# Patient Record
Sex: Female | Born: 1980 | Race: White | Hispanic: No | Marital: Married | Smoking: Former smoker
Health system: Southern US, Community
[De-identification: ages and names within clinical notes are randomized; demographics above are authoritative.]

## PROBLEM LIST (undated history)

## (undated) DIAGNOSIS — N390 Urinary tract infection, site not specified: Secondary | ICD-10-CM

## (undated) DIAGNOSIS — M542 Cervicalgia: Secondary | ICD-10-CM

## (undated) DIAGNOSIS — K5900 Constipation, unspecified: Secondary | ICD-10-CM

## (undated) DIAGNOSIS — M1990 Unspecified osteoarthritis, unspecified site: Secondary | ICD-10-CM

## (undated) DIAGNOSIS — E119 Type 2 diabetes mellitus without complications: Secondary | ICD-10-CM

## (undated) DIAGNOSIS — F419 Anxiety disorder, unspecified: Secondary | ICD-10-CM

## (undated) DIAGNOSIS — G8929 Other chronic pain: Secondary | ICD-10-CM

## (undated) DIAGNOSIS — G43909 Migraine, unspecified, not intractable, without status migrainosus: Secondary | ICD-10-CM

## (undated) DIAGNOSIS — R7303 Prediabetes: Secondary | ICD-10-CM

## (undated) DIAGNOSIS — K602 Anal fissure, unspecified: Secondary | ICD-10-CM

## (undated) DIAGNOSIS — K649 Unspecified hemorrhoids: Secondary | ICD-10-CM

## (undated) HISTORY — DX: Anxiety disorder, unspecified: F41.9

## (undated) HISTORY — DX: Cervicalgia: M54.2

## (undated) HISTORY — DX: Urinary tract infection, site not specified: N39.0

## (undated) HISTORY — DX: Unspecified osteoarthritis, unspecified site: M19.90

## (undated) HISTORY — PX: TONSILLECTOMY: SUR1361

## (undated) HISTORY — DX: Prediabetes: R73.03

## (undated) HISTORY — DX: Unspecified hemorrhoids: K64.9

## (undated) HISTORY — DX: Constipation, unspecified: K59.00

## (undated) HISTORY — DX: Migraine, unspecified, not intractable, without status migrainosus: G43.909

## (undated) HISTORY — PX: BUNIONECTOMY: SHX129

## (undated) HISTORY — DX: Anal fissure, unspecified: K60.2

## (undated) HISTORY — PX: CERVICAL FUSION: SHX112

## (undated) HISTORY — DX: Other chronic pain: G89.29

## (undated) HISTORY — PX: TUBAL LIGATION: SHX77

---
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
## 2007-08-31 ENCOUNTER — Ambulatory Visit (HOSPITAL_COMMUNITY): Admission: RE | Admit: 2007-08-31 | Discharge: 2007-08-31 | Payer: Self-pay | Admitting: Family Medicine

## 2008-09-08 ENCOUNTER — Ambulatory Visit (HOSPITAL_COMMUNITY): Admission: RE | Admit: 2008-09-08 | Discharge: 2008-09-08 | Payer: Self-pay | Admitting: Family Medicine

## 2008-12-11 ENCOUNTER — Ambulatory Visit (HOSPITAL_COMMUNITY): Admission: RE | Admit: 2008-12-11 | Discharge: 2008-12-11 | Payer: Self-pay | Admitting: Family Medicine

## 2009-02-21 ENCOUNTER — Ambulatory Visit (HOSPITAL_COMMUNITY): Admission: RE | Admit: 2009-02-21 | Discharge: 2009-02-21 | Payer: Self-pay | Admitting: Family Medicine

## 2010-03-12 ENCOUNTER — Inpatient Hospital Stay (HOSPITAL_COMMUNITY): Admission: AD | Admit: 2010-03-12 | Discharge: 2010-03-12 | Payer: Self-pay | Admitting: Obstetrics and Gynecology

## 2010-03-16 ENCOUNTER — Inpatient Hospital Stay (HOSPITAL_COMMUNITY): Admission: AD | Admit: 2010-03-16 | Discharge: 2010-03-16 | Payer: Self-pay | Admitting: Obstetrics & Gynecology

## 2010-05-13 ENCOUNTER — Ambulatory Visit: Payer: Self-pay | Admitting: Advanced Practice Midwife

## 2010-05-13 ENCOUNTER — Inpatient Hospital Stay (HOSPITAL_COMMUNITY): Admission: AD | Admit: 2010-05-13 | Discharge: 2010-05-13 | Payer: Self-pay | Admitting: Obstetrics and Gynecology

## 2010-06-12 ENCOUNTER — Other Ambulatory Visit: Payer: Self-pay | Admitting: Obstetrics and Gynecology

## 2010-06-12 ENCOUNTER — Inpatient Hospital Stay (HOSPITAL_COMMUNITY): Admission: EM | Admit: 2010-06-12 | Discharge: 2010-06-14 | Payer: Self-pay | Admitting: Emergency Medicine

## 2010-06-13 ENCOUNTER — Encounter (INDEPENDENT_AMBULATORY_CARE_PROVIDER_SITE_OTHER): Payer: Self-pay | Admitting: Diagnostic Neuroimaging

## 2010-07-16 ENCOUNTER — Encounter (INDEPENDENT_AMBULATORY_CARE_PROVIDER_SITE_OTHER): Payer: Self-pay | Admitting: Obstetrics and Gynecology

## 2010-07-16 ENCOUNTER — Inpatient Hospital Stay (HOSPITAL_COMMUNITY): Admission: AD | Admit: 2010-07-16 | Discharge: 2010-07-16 | Payer: Self-pay | Admitting: Obstetrics and Gynecology

## 2010-07-16 ENCOUNTER — Ambulatory Visit: Payer: Self-pay | Admitting: Obstetrics and Gynecology

## 2010-07-16 ENCOUNTER — Ambulatory Visit: Payer: Self-pay | Admitting: Vascular Surgery

## 2010-07-18 ENCOUNTER — Inpatient Hospital Stay (HOSPITAL_COMMUNITY): Admission: RE | Admit: 2010-07-18 | Discharge: 2010-07-21 | Payer: Self-pay | Admitting: Obstetrics and Gynecology

## 2010-07-18 ENCOUNTER — Encounter (INDEPENDENT_AMBULATORY_CARE_PROVIDER_SITE_OTHER): Payer: Self-pay | Admitting: Obstetrics and Gynecology

## 2011-02-07 LAB — CBC
HCT: 30.7 % — ABNORMAL LOW (ref 36.0–46.0)
HCT: 32.2 % — ABNORMAL LOW (ref 36.0–46.0)
MCH: 34.3 pg — ABNORMAL HIGH (ref 26.0–34.0)
MCH: 34.6 pg — ABNORMAL HIGH (ref 26.0–34.0)
MCHC: 35.3 g/dL (ref 30.0–36.0)
MCV: 97.2 fL (ref 78.0–100.0)
MCV: 97.3 fL (ref 78.0–100.0)
Platelets: 134 10*3/uL — ABNORMAL LOW (ref 150–400)
Platelets: 145 10*3/uL — ABNORMAL LOW (ref 150–400)
RBC: 2.78 MIL/uL — ABNORMAL LOW (ref 3.87–5.11)
RDW: 12.8 % (ref 11.5–15.5)
RDW: 13.1 % (ref 11.5–15.5)
WBC: 6.4 10*3/uL (ref 4.0–10.5)
WBC: 7.9 10*3/uL (ref 4.0–10.5)

## 2011-02-07 LAB — BASIC METABOLIC PANEL
BUN: 4 mg/dL — ABNORMAL LOW (ref 6–23)
CO2: 24 mEq/L (ref 19–32)
Calcium: 9 mg/dL (ref 8.4–10.5)
Chloride: 107 mEq/L (ref 96–112)
Creatinine, Ser: 0.38 mg/dL — ABNORMAL LOW (ref 0.4–1.2)
Potassium: 3.6 mEq/L (ref 3.5–5.1)
Sodium: 135 mEq/L (ref 135–145)

## 2011-02-07 LAB — COMPREHENSIVE METABOLIC PANEL
ALT: 14 U/L (ref 0–35)
GFR calc non Af Amer: >60 mL/min (ref >60)
Glucose, Bld: 87 mg/dL (ref 70–99)
Potassium: 4.2 mEq/L (ref 3.5–5.1)
Sodium: 134 mEq/L — ABNORMAL LOW (ref 135–145)
Total Bilirubin: 0.5 mg/dL (ref 0.3–1.2)

## 2011-02-07 LAB — RPR: RPR Ser Ql: NONREACTIVE

## 2011-02-07 LAB — URIC ACID: Uric Acid, Serum: 3.3 mg/dL (ref 2.4–7.0)

## 2011-02-08 LAB — CBC
Hemoglobin: 11.8 g/dL — ABNORMAL LOW (ref 12.0–15.0)
MCH: 34 pg (ref 26.0–34.0)
Platelets: 165 10*3/uL (ref 150–400)
RBC: 3.46 MIL/uL — ABNORMAL LOW (ref 3.87–5.11)
WBC: 9.7 10*3/uL (ref 4.0–10.5)

## 2011-02-08 LAB — URINALYSIS, ROUTINE W REFLEX MICROSCOPIC
Bilirubin Urine: NEGATIVE
Glucose, UA: 100 mg/dL — AB
Nitrite: NEGATIVE
Specific Gravity, Urine: 1.01 (ref 1.005–1.030)
Urobilinogen, UA: 0.2 mg/dL (ref 0.0–1.0)
pH: 7 (ref 5.0–8.0)

## 2011-02-08 LAB — BASIC METABOLIC PANEL
BUN: 3 mg/dL — ABNORMAL LOW (ref 6–23)
CO2: 23 mEq/L (ref 19–32)
Calcium: 8.4 mg/dL (ref 8.4–10.5)
Chloride: 103 mEq/L (ref 96–112)
Creatinine, Ser: 0.47 mg/dL (ref 0.4–1.2)
GFR calc Af Amer: >60 mL/min (ref >60)
GFR calc non Af Amer: >60 mL/min (ref >60)
Glucose, Bld: 82 mg/dL (ref 70–99)
Potassium: 3.7 mEq/L (ref 3.5–5.1)
Sodium: 132 mEq/L — ABNORMAL LOW (ref 135–145)

## 2011-02-08 LAB — PROTIME-INR: INR: 0.9 (ref 0.00–1.49)

## 2011-02-08 LAB — COMPREHENSIVE METABOLIC PANEL
ALT: 11 U/L (ref 0–35)
AST: 17 U/L (ref 0–37)
CO2: 24 mEq/L (ref 19–32)
Calcium: 8.5 mg/dL (ref 8.4–10.5)
Creatinine, Ser: 0.46 mg/dL (ref 0.4–1.2)
GFR calc Af Amer: >60 mL/min (ref >60)
GFR calc non Af Amer: >60 mL/min (ref >60)
Sodium: 132 mEq/L — ABNORMAL LOW (ref 135–145)
Total Protein: 5.9 g/dL — ABNORMAL LOW (ref 6.0–8.3)

## 2011-02-08 LAB — CARDIAC PANEL(CRET KIN+CKTOT+MB+TROPI)
Relative Index: INVALID (ref 0.0–2.5)
Total CK: 34 U/L (ref 7–177)
Troponin I: 0.01 ng/mL (ref 0.00–0.06)

## 2011-02-08 LAB — LIPID PANEL
HDL: 57 mg/dL (ref >39)
Triglycerides: 471 mg/dL — ABNORMAL HIGH (ref <150)
VLDL: UNDETERMINED mg/dL (ref 0–40)

## 2011-02-08 LAB — APTT: aPTT: 27 seconds (ref 24–37)

## 2011-02-08 LAB — GLUCOSE, CAPILLARY
Glucose-Capillary: 105 mg/dL — ABNORMAL HIGH (ref 70–99)
Glucose-Capillary: 135 mg/dL — ABNORMAL HIGH (ref 70–99)
Glucose-Capillary: 71 mg/dL (ref 70–99)
Glucose-Capillary: 82 mg/dL (ref 70–99)
Glucose-Capillary: 89 mg/dL (ref 70–99)

## 2011-02-08 LAB — DIFFERENTIAL
Lymphocytes Relative: 24 % (ref 12–46)
Lymphs Abs: 2.3 10*3/uL (ref 0.7–4.0)
Monocytes Relative: 6 % (ref 3–12)
Neutro Abs: 6.6 10*3/uL (ref 1.7–7.7)
Neutrophils Relative %: 68 % (ref 43–77)

## 2011-02-08 LAB — HEMOGLOBIN A1C: Hgb A1c MFr Bld: 5.1 % (ref <5.7)

## 2011-02-09 LAB — COMPREHENSIVE METABOLIC PANEL
ALT: 12 U/L (ref 0–35)
AST: 16 U/L (ref 0–37)
Calcium: 9.3 mg/dL (ref 8.4–10.5)
Total Protein: 5.7 g/dL — ABNORMAL LOW (ref 6.0–8.3)

## 2011-02-09 LAB — URINALYSIS, ROUTINE W REFLEX MICROSCOPIC
Bilirubin Urine: NEGATIVE
Glucose, UA: NEGATIVE mg/dL
Hgb urine dipstick: NEGATIVE
Specific Gravity, Urine: >1.03 — ABNORMAL HIGH (ref 1.005–1.030)

## 2011-02-09 LAB — GLUCOSE, CAPILLARY: Glucose-Capillary: 103 mg/dL — ABNORMAL HIGH (ref 70–99)

## 2011-02-09 LAB — CBC
HCT: 32.4 % — ABNORMAL LOW (ref 36.0–46.0)
Hemoglobin: 11.6 g/dL — ABNORMAL LOW (ref 12.0–15.0)
MCV: 96.7 fL (ref 78.0–100.0)
RBC: 3.36 MIL/uL — ABNORMAL LOW (ref 3.87–5.11)
WBC: 8.9 10*3/uL (ref 4.0–10.5)

## 2011-02-09 LAB — LACTATE DEHYDROGENASE: LDH: 113 U/L (ref 94–250)

## 2011-02-11 LAB — COMPREHENSIVE METABOLIC PANEL
AST: 16 U/L (ref 0–37)
Alkaline Phosphatase: 60 U/L (ref 39–117)
CO2: 25 mEq/L (ref 19–32)
Chloride: 103 mEq/L (ref 96–112)
Creatinine, Ser: 0.46 mg/dL (ref 0.4–1.2)
GFR calc Af Amer: >60 mL/min (ref >60)
GFR calc non Af Amer: >60 mL/min (ref >60)
Potassium: 3.8 mEq/L (ref 3.5–5.1)
Total Bilirubin: 0.5 mg/dL (ref 0.3–1.2)

## 2011-02-11 LAB — CBC
HCT: 35.6 % — ABNORMAL LOW (ref 36.0–46.0)
MCV: 96.3 fL (ref 78.0–100.0)
RBC: 3.7 MIL/uL — ABNORMAL LOW (ref 3.87–5.11)
WBC: 10.9 10*3/uL — ABNORMAL HIGH (ref 4.0–10.5)

## 2011-02-11 LAB — DIFFERENTIAL
Basophils Absolute: 0.1 10*3/uL (ref 0.0–0.1)
Basophils Relative: 1 % (ref 0–1)
Eosinophils Absolute: 0.2 10*3/uL (ref 0.0–0.7)
Eosinophils Relative: 2 % (ref 0–5)
Lymphocytes Relative: 19 % (ref 12–46)
Monocytes Absolute: 0.6 10*3/uL (ref 0.1–1.0)

## 2011-02-11 LAB — URINALYSIS, ROUTINE W REFLEX MICROSCOPIC
Bilirubin Urine: NEGATIVE
Hgb urine dipstick: NEGATIVE
Ketones, ur: NEGATIVE mg/dL
Nitrite: NEGATIVE
Protein, ur: NEGATIVE mg/dL
Specific Gravity, Urine: 1.02 (ref 1.005–1.030)
Urobilinogen, UA: 0.2 mg/dL (ref 0.0–1.0)

## 2011-02-11 LAB — LIPASE, BLOOD: Lipase: 18 U/L (ref 11–59)

## 2011-02-11 LAB — AMYLASE: Amylase: 37 U/L (ref 0–105)

## 2012-03-14 ENCOUNTER — Emergency Department (HOSPITAL_COMMUNITY)
Admission: EM | Admit: 2012-03-14 | Discharge: 2012-03-15 | Disposition: A | Discharge status: Home / Self Care | Payer: Medicaid Other | Attending: Emergency Medicine | Admitting: Emergency Medicine

## 2012-03-14 ENCOUNTER — Encounter (HOSPITAL_COMMUNITY): Payer: Self-pay | Admitting: Emergency Medicine

## 2012-03-14 DIAGNOSIS — K644 Residual hemorrhoidal skin tags: Secondary | ICD-10-CM | POA: Insufficient documentation

## 2012-03-14 DIAGNOSIS — K625 Hemorrhage of anus and rectum: Secondary | ICD-10-CM | POA: Insufficient documentation

## 2012-03-14 DIAGNOSIS — K6289 Other specified diseases of anus and rectum: Secondary | ICD-10-CM | POA: Insufficient documentation

## 2012-03-14 DIAGNOSIS — R509 Fever, unspecified: Secondary | ICD-10-CM | POA: Insufficient documentation

## 2012-03-14 DIAGNOSIS — R141 Gas pain: Secondary | ICD-10-CM | POA: Insufficient documentation

## 2012-03-14 DIAGNOSIS — K5900 Constipation, unspecified: Secondary | ICD-10-CM | POA: Insufficient documentation

## 2012-03-14 DIAGNOSIS — R142 Eructation: Secondary | ICD-10-CM | POA: Insufficient documentation

## 2012-03-14 DIAGNOSIS — R1084 Generalized abdominal pain: Secondary | ICD-10-CM | POA: Insufficient documentation

## 2012-03-14 DIAGNOSIS — R198 Other specified symptoms and signs involving the digestive system and abdomen: Secondary | ICD-10-CM | POA: Insufficient documentation

## 2012-03-14 DIAGNOSIS — K921 Melena: Secondary | ICD-10-CM | POA: Insufficient documentation

## 2012-03-14 MED ORDER — SODIUM CHLORIDE 0.9 % IV SOLN
1000.0000 mL | INTRAVENOUS | Status: DC
  Administered 2012-03-15: 1000 mL via INTRAVENOUS

## 2012-03-14 MED ORDER — SODIUM CHLORIDE 0.9 % IV SOLN
1000.0000 mL | Freq: Once | INTRAVENOUS | Status: AC
  Administered 2012-03-14: 1000 mL via INTRAVENOUS

## 2012-03-14 NOTE — ED Notes (Signed)
Pt noticed Friday night after giving herself a enema there was a large amount of bright red blood in the toilet.  Pt states that sense then she has had bright red blood with each BM.  Fever of 100 tonight.

## 2012-03-15 ENCOUNTER — Emergency Department (HOSPITAL_COMMUNITY): Payer: Medicaid Other

## 2012-03-15 LAB — DIFFERENTIAL
Eosinophils Relative: 2 % (ref 0–5)
Lymphocytes Relative: 27 % (ref 12–46)
Lymphs Abs: 2.9 10*3/uL (ref 0.7–4.0)
Monocytes Absolute: 0.5 10*3/uL (ref 0.1–1.0)
Monocytes Relative: 4 % (ref 3–12)
Neutro Abs: 7.1 10*3/uL (ref 1.7–7.7)

## 2012-03-15 LAB — PROTIME-INR: Prothrombin Time: 13.2 seconds (ref 11.6–15.2)

## 2012-03-15 LAB — URINALYSIS, ROUTINE W REFLEX MICROSCOPIC
Bilirubin Urine: NEGATIVE
Glucose, UA: NEGATIVE mg/dL
Hgb urine dipstick: NEGATIVE
Protein, ur: NEGATIVE mg/dL

## 2012-03-15 LAB — CBC
HCT: 37.2 % (ref 36.0–46.0)
Hemoglobin: 12.7 g/dL (ref 12.0–15.0)
MCV: 93.2 fL (ref 78.0–100.0)
RDW: 12.2 % (ref 11.5–15.5)
WBC: 10.8 10*3/uL — ABNORMAL HIGH (ref 4.0–10.5)

## 2012-03-15 LAB — APTT: aPTT: 31 seconds (ref 24–37)

## 2012-03-15 LAB — COMPREHENSIVE METABOLIC PANEL
AST: 17 U/L (ref 0–37)
BUN: 10 mg/dL (ref 6–23)
CO2: 26 mEq/L (ref 19–32)
Calcium: 9.1 mg/dL (ref 8.4–10.5)
Chloride: 102 mEq/L (ref 96–112)
Creatinine, Ser: 0.75 mg/dL (ref 0.50–1.10)
GFR calc Af Amer: >90 mL/min (ref >90)
GFR calc non Af Amer: >90 mL/min (ref >90)
Glucose, Bld: 127 mg/dL — ABNORMAL HIGH (ref 70–99)
Total Bilirubin: 0.3 mg/dL (ref 0.3–1.2)

## 2012-03-15 LAB — POCT PREGNANCY, URINE: Preg Test, Ur: NEGATIVE

## 2012-03-15 LAB — LIPASE, BLOOD: Lipase: 18 U/L (ref 11–59)

## 2012-03-15 MED ORDER — HYDROMORPHONE HCL PF 2 MG/ML IJ SOLN
INTRAMUSCULAR | Status: AC
  Administered 2012-03-15: 1 mg
  Filled 2012-03-15: qty 1

## 2012-03-15 MED ORDER — LIDOCAINE HCL (CARDIAC) 20 MG/ML IV SOLN
INTRAVENOUS | Status: AC
  Administered 2012-03-15: 02:00:00
  Filled 2012-03-15: qty 5

## 2012-03-15 MED ORDER — HYDROMORPHONE HCL PF 1 MG/ML IJ SOLN
1.0000 mg | Freq: Once | INTRAMUSCULAR | Status: AC
  Administered 2012-03-15: 1 mg via INTRAVENOUS
  Filled 2012-03-15: qty 1

## 2012-03-15 MED ORDER — SENNOSIDES-DOCUSATE SODIUM 8.6-50 MG PO TABS: 1.0000 | ORAL_TABLET | Freq: Every day | ORAL | Status: AC | PRN

## 2012-03-15 MED ORDER — PHENYLEPH-SHARK LIV OIL-MO-PET 0.25-3-14-71.9 % RE OINT: TOPICAL_OINTMENT | Freq: Two times a day (BID) | RECTAL | Status: AC | PRN

## 2012-03-15 MED ORDER — HYDROMORPHONE HCL PF 1 MG/ML IJ SOLN: 1.0000 mg | Freq: Once | INTRAMUSCULAR | Status: DC

## 2012-03-15 MED ORDER — MAGNESIUM SULFATE GRAN: GRANULES | Freq: Two times a day (BID) | Status: AC | PRN

## 2012-03-15 MED ORDER — LIDOCAINE HCL 2 % EX GEL: Freq: Once | CUTANEOUS | Status: DC

## 2012-03-15 MED ORDER — OXYCODONE-ACETAMINOPHEN 5-325 MG PO TABS: 2.0000 | ORAL_TABLET | ORAL | Status: AC | PRN

## 2012-03-15 MED ORDER — ONDANSETRON HCL 4 MG/2ML IJ SOLN
4.0000 mg | Freq: Once | INTRAMUSCULAR | Status: AC
  Administered 2012-03-15: 4 mg via INTRAVENOUS

## 2012-03-15 MED ORDER — HYDROCORTISONE ACETATE 25 MG RE SUPP: 25.0000 mg | Freq: Two times a day (BID) | RECTAL | Status: AC | PRN

## 2012-03-15 NOTE — ED Provider Notes (Signed)
History     CSN: [id number]  Arrival date & time 03/14/12  2219   First MD Initiated Contact with Patient 03/14/12 2312      Chief Complaint  Patient presents with  . Rectal Bleeding  . Fever    (Consider location/radiation/quality/duration/timing/severity/associated sxs/prior treatment) HPI Comments: The patient is a 31 year old female with a history of hemorrhoids secondary to constipation from frequent use of Percocet for chronic pain. Her constipation is not new, however on Friday, 2 days ago, she was administering an enema to herself to treat constipation and upon purging the rectal effluent, she noticed red blood and blood clots present. She reports several more episodes of tenesmus and small amounts of red blood and blood clots passed into the toilet over the course that night. Rectal bleeding has stopped at this time. She reports rectal pain, reports mild generalized cramping abdominal discomfort and fullness, denies nausea or vomiting, dysuria, vaginal bleeding or vaginal discharge. She denies previous history of similar symptoms. She denies shortness of breath, lightheadedness, chest pain, or palpitations. She has currently been treating her hemorrhoids with warm water bath soaks and preparation H. cream, witch hazel pads externally. She reports insufficient relief of discomfort at the anus.  Patient is a 31 y.o. female presenting with hematochezia. The history is provided by the patient.  Rectal Bleeding  The current episode started 2 days ago. The onset was sudden. The problem occurs occasionally. The problem has been resolved. The pain is moderate. The stool is described as hard and streaked with blood. Prior successful therapies include enemas. Associated symptoms include abdominal pain (mild, diffuse, crampy, with perceived bloating, attributed by patient to constipation.  No recent change in this symptom with the rectal bleeding.), hemorrhoids and rectal pain. Pertinent negatives  include no anorexia, no fever (pt. reports temperature of 100 degrees Fahrenheit today), no diarrhea, no hematemesis, no nausea, no vomiting, no hematuria, no vaginal bleeding, no vaginal discharge, no chest pain, no headaches, no coughing, no difficulty breathing and no rash. She has been behaving normally. She has been eating and drinking normally. Urine output has been normal. Her past medical history does not include abdominal surgery (prior caesarian section, otherwise, no abdominal surgeries.), inflammatory bowel disease, recent abdominal injury, recent antibiotic use, recent change in diet or a recent illness. There were no sick contacts. She has received no recent medical care.    History reviewed. No pertinent past medical history.  Past Surgical History  Procedure Date  . Cesarean section   . Tonsillectomy     No family history on file.  History  Substance Use Topics  . Smoking status: Current Everyday Smoker -- 2.0 packs/day for 10 years    Types: Cigarettes  . Smokeless tobacco: Not on file  . Alcohol Use: Yes    OB History    Grav Para Term Preterm Abortions TAB SAB Ect Mult Living                  Review of Systems  Constitutional: Negative for fever (pt. reports temperature of 100 degrees Fahrenheit today), diaphoresis, fatigue and unexpected weight change.  HENT: Negative.   Eyes: Negative.   Respiratory: Negative.  Negative for cough.   Cardiovascular: Negative.  Negative for chest pain.  Gastrointestinal: Positive for abdominal pain (mild, diffuse, crampy, with perceived bloating, attributed by patient to constipation.  No recent change in this symptom with the rectal bleeding.), blood in stool, hematochezia, anal bleeding, rectal pain and hemorrhoids. Negative for nausea, vomiting,  diarrhea, constipation, abdominal distention, anorexia and hematemesis.  Genitourinary: Negative for hematuria, vaginal bleeding and vaginal discharge.  Musculoskeletal: Negative.     Skin: Negative for color change, pallor and rash.  Neurological: Negative for syncope, weakness, light-headedness and headaches.  Hematological: Does not bruise/bleed easily.  Psychiatric/Behavioral: Negative.   All other systems reviewed and are negative.    Allergies  Wellbutrin and Cephalosporins  Home Medications   Current Outpatient Rx  Name Route Sig Dispense Refill  . HYDROCORTISONE ACETATE 25 MG RE SUPP Rectal Place 1 suppository (25 mg total) rectally 2 (two) times daily as needed for hemorrhoids. 24 suppository 1  . MAGNESIUM SULFATE GRAN Topical Apply topically 2 (two) times daily as needed. Mix with warm water to make a Sitz Bath to soak hemorrhoids in for relief of pain and inflammation.  Mixing instructions are on the carton/box. 500 g 0  . OXYCODONE-ACETAMINOPHEN 5-325 MG PO TABS Oral Take 2 tablets by mouth every 4 (four) hours as needed for pain. 15 tablet 0  . PHENYLEPH-SHARK LIV OIL-MO-PET 0.25-3-14-71.9 % RE OINT Rectal Place rectally 2 (two) times daily as needed for hemorrhoids. This medication may be used in combination with Anusol-HC suppository 30 g 1  . SENNOSIDES-DOCUSATE SODIUM 8.6-50 MG PO TABS Oral Take 1-2 tablets by mouth daily as needed for constipation. 30 tablet 1    BP 122/53  Pulse 114  Temp(Src) 99 F (37.2 C) (Oral)  Resp 20  Ht 5\' 2"  (1.575 m)  Wt 123 lb (55.792 kg)  BMI 22.50 kg/m2  SpO2 99%  LMP 03/07/2012  Physical Exam  Nursing note and vitals reviewed. Constitutional: She is oriented to person, place, and time. She appears well-developed and well-nourished. No distress.  HENT:  Head: Normocephalic and atraumatic.  Nose: Nose normal.  Mouth/Throat: Oropharynx is clear and moist. No oropharyngeal exudate.  Eyes: Conjunctivae and EOM are normal. Pupils are equal, round, and reactive to light. Right eye exhibits no discharge. Left eye exhibits no discharge.  Neck: Normal range of motion. Neck supple. No JVD present.  Cardiovascular:  Normal rate, regular rhythm, normal heart sounds and intact distal pulses.  Exam reveals no gallop and no friction rub.   No murmur heard. Pulmonary/Chest: Effort normal and breath sounds normal. No respiratory distress. She has no wheezes. She has no rales. She exhibits no tenderness.  Abdominal: Soft. Normal appearance and bowel sounds are normal. She exhibits no distension, no pulsatile liver, no abdominal bruit, no ascites, no pulsatile midline mass and no mass. There is no hepatosplenomegaly. There is generalized tenderness (mild). There is no rigidity, no rebound, no guarding, no CVA tenderness, no tenderness at McBurney's point and negative Murphy's sign. No hernia.  Genitourinary: Rectal exam shows external hemorrhoid and tenderness. Rectal exam shows no internal hemorrhoid, no fissure, no mass and anal tone normal. Guaiac positive stool (faint pink residual secretions on rectal exam, no stool, no blood clots, no active bleeding rectally). Pelvic exam was performed with patient supine.       Anoscope inserted gently into the patient's rectum for internal rectal evaluation.  No apparent internal hemorrhoids or mucosal lesions.  Pt. Passing gas.  External hemorrhoids x 3, prolapsed/reducible, non-thrombosed, without active bleeding, moderate tenderness to palpation.  No internal rectal fluctuance/mass on digital rectal exam to suggest perirectal abscess.  Musculoskeletal: Normal range of motion. She exhibits no edema and no tenderness.  Neurological: She is alert and oriented to person, place, and time. No cranial nerve deficit. She exhibits normal muscle  tone. Coordination normal.  Skin: Skin is warm and dry. No rash noted. No erythema. No pallor.  Psychiatric: She has a normal mood and affect. Her behavior is normal. Judgment and thought content normal.    ED Course  Procedures (including critical care time)  Labs Reviewed  CBC - Abnormal; Notable for the following:    WBC 10.8 (*)    All  other components within normal limits  COMPREHENSIVE METABOLIC PANEL - Abnormal; Notable for the following:    Glucose, Bld 127 (*)    All other components within normal limits  URINALYSIS, ROUTINE W REFLEX MICROSCOPIC - Abnormal; Notable for the following:    Specific Gravity, Urine <1.005 (*)    All other components within normal limits  DIFFERENTIAL  LIPASE, BLOOD  APTT  PROTIME-INR  POCT PREGNANCY, URINE   Dg Abd Acute W/chest  03/15/2012   *RADIOLOGY REPORT*  Clinical Data: Blood in stool  ACUTE ABDOMEN SERIES (ABDOMEN 2 VIEW & CHEST 1 VIEW)  Comparison:  None.  Findings:  There is no evidence of dilated bowel loops or free intraperitoneal air.  No radiopaque calculi or other significant radiographic abnormality is seen. Heart size and mediastinal contours are within normal limits.  Both lungs are clear. Incidental note is made of a 2 mm radiopacity over the right upper quadrant which does not overlie the expected contours of the kidney and could represent bowel content, artifact, gallstones, or possible aberrant location of the kidney with renal calculus.  IMPRESSION: Negative abdominal radiographs.  No acute cardiopulmonary disease.  Original Report Authenticated By: Harrel Lemon, M.D.     1. External hemorrhoid   2. Rectal pain   3. Constipation       MDM  Gastritis, peptic ulcer disease, biliary colic, cholelithiasis, cholecystitis, cholangitis, hepatitis, renal colic, urinary tract infection, colitis, constipation, gastroenteritis, diverticulitis, inflammatory bowel disease, internal hemorrhoids, external hemorrhoids, perirectal abscess, cancer all considered among other etiologies in the patient's differential diagnosis.       Felisa Bonier, MD 03/15/12 0630

## 2012-03-15 NOTE — Discharge Instructions (Signed)
Constipation in Adults Constipation is having fewer than 2 bowel movements per week. Usually, the stools are hard. As we grow older, constipation is more common. If you try to fix constipation with laxatives, the problem may get worse. This is because laxatives taken over a long period of time make the colon muscles weaker. A low-fiber diet, not taking in enough fluids, and taking some medicines may make these problems worse. MEDICATIONS THAT MAY CAUSE CONSTIPATION  Water pills (diuretics).   Calcium channel blockers (used to control blood pressure and for the heart).   Certain pain medicines (narcotics).   Anticholinergics.   Anti-inflammatory agents.   Antacids that contain aluminum.  DISEASES THAT CONTRIBUTE TO CONSTIPATION  Diabetes.   Parkinson's disease.   Dementia.   Stroke.   Depression.   Illnesses that cause problems with salt and water metabolism.  HOME CARE INSTRUCTIONS   Constipation is usually best cared for without medicines. Increasing dietary fiber and eating more fruits and vegetables is the best way to manage constipation.   Slowly increase fiber intake to 25 to 38 grams per day. Whole grains, fruits, vegetables, and legumes are good sources of fiber. A dietitian can further help you incorporate high-fiber foods into your diet.   Drink enough water and fluids to keep your urine clear or pale yellow.   A fiber supplement may be added to your diet if you cannot get enough fiber from foods.   Increasing your activities also helps improve regularity.   Suppositories, as suggested by your caregiver, will also help. If you are using antacids, such as aluminum or calcium containing products, it will be helpful to switch to products containing magnesium if your caregiver says it is okay.   If you have been given a liquid injection (enema) today, this is only a temporary measure. It should not be relied on for treatment of longstanding (chronic) constipation.    Stronger measures, such as magnesium sulfate, should be avoided if possible. This may cause uncontrollable diarrhea. Using magnesium sulfate may not allow you time to make it to the bathroom.  SEEK IMMEDIATE MEDICAL CARE IF:   There is bright red blood in the stool.   The constipation stays for more than 4 days.   There is belly (abdominal) or rectal pain.   You do not seem to be getting better.   You have any questions or concerns.  MAKE SURE YOU:   Understand these instructions.   Will watch your condition.   Will get help right away if you are not doing well or get worse.  Document Released: 08/08/2004 Document Revised: 10/30/2011 Document Reviewed: 10/14/2011 [facility] Patient Information 2012 [place], [place].Fiber Content in Foods Drinking plenty of fluids and consuming foods high in fiber can help with constipation. See the list below for the fiber content of some common foods. Starches and Grains / Dietary Fiber (g)  Cheerios, 1 cup / 3 g   Kellogg's Corn Flakes, 1 cup / 0.7 g   Rice Krispies, 1  cup / 0.3 g   Quaker Oat Life Cereal,  cup / 2.1 g   Oatmeal, instant (cooked),  cup / 2 g   Kellogg's Frosted Mini Wheats, 1 cup / 5.1 g   Rice, brown, long-grain (cooked), 1 cup / 3.5 g   Rice, white, long-grain (cooked), 1 cup / 0.6 g   Macaroni, cooked, enriched, 1 cup / 2.5 g  Legumes / Dietary Fiber (g)  Beans, baked, canned, plain or vegetarian,  cup /  5.2 g   Beans, kidney, canned,  cup / 6.8 g   Beans, pinto, dried (cooked),  cup / 7.7 g   Beans, pinto, canned,  cup / 5.5 g  Breads and Crackers / Dietary Fiber (g)  Graham crackers, plain or honey, 2 squares / 0.7 g   Saltine crackers, 3 squares / 0.3 g   Pretzels, plain, salted, 10 pieces / 1.8 g   Bread, whole-wheat, 1 slice / 1.9 g   Bread, white, 1 slice / 0.7 g   Bread, raisin, 1 slice / 1.2 g   Bagel, plain, 3 oz / 2 g   Tortilla, flour, 1 oz / 0.9 g   Tortilla, corn, 1 small  / 1.5 g   Bun, hamburger or hotdog, 1 small / 0.9 g  Fruits / Dietary Fiber (g)  Apple, raw with skin, 1 medium / 4.4 g   Applesauce, sweetened,  cup / 1.5 g   Banana,  medium / 1.5 g   Grapes, 10 grapes / 0.4 g   Orange, 1 small / 2.3 g   Raisin, 1.5 oz / 1.6 g   Melon, 1 cup / 1.4 g  Vegetables / Dietary Fiber (g)  Green beans, canned,  cup / 1.3 g   Carrots (cooked),  cup / 2.3 g   Broccoli (cooked),  cup / 2.8 g   Peas, frozen (cooked),  cup / 4.4 g   Potatoes, mashed,  cup / 1.6 g   Lettuce, 1 cup / 0.5 g   Corn, canned,  cup / 1.6 g   Tomato,  cup / 1.1 g  Document Released: 03/29/2007 Document Revised: 10/30/2011 Document Reviewed: 05/24/2007 [facility] Patient Information 2012 [place], [place].High Fiber Diet A high fiber diet changes your normal diet to include more whole grains, legumes, fruits, and vegetables. Changes in the diet involve replacing refined carbohydrates with unrefined foods. The calorie level of the diet is essentially unchanged. The Dietary Reference Intake (recommended amount) for adult males is 38 g per day. For adult females, it is 25 g per day. Pregnant and lactating women should consume 28 g of fiber per day. Fiber is the intact part of a plant that is not broken down during digestion. Functional fiber is fiber that has been isolated from the plant to provide a beneficial effect in the body. PURPOSE  Increase stool bulk.   Ease and regulate bowel movements.   Lower cholesterol.  INDICATIONS THAT YOU NEED MORE FIBER  Constipation and hemorrhoids.   Uncomplicated diverticulosis (intestine condition) and irritable bowel syndrome.   Weight management.   As a protective measure against hardening of the arteries (atherosclerosis), diabetes, and cancer.  NOTE OF CAUTION If you have a digestive or bowel problem, ask your caregiver for advice before adding high fiber foods to your diet. Some of the following medical problems are  such that a high fiber diet should not be used without consulting your caregiver:  Acute diverticulitis (intestine infection).   Partial small bowel obstructions.   Complicated diverticular disease involving bleeding, rupture (perforation), or abscess (boil, furuncle).   Presence of autonomic neuropathy (nerve damage) or gastric paresis (stomach cannot empty itself).  GUIDELINES FOR INCREASING FIBER  Start adding fiber to the diet slowly. A gradual increase of about 5 more grams (2 slices of whole-wheat bread, 2 servings of most fruits or vegetables, or 1 bowl of high fiber cereal) per day is best. Too rapid an increase in fiber may result in constipation, flatulence,  and bloating.   Drink enough water and fluids to keep your urine clear or pale yellow. Water, juice, or caffeine-free drinks are recommended. Not drinking enough fluid may cause constipation.   Eat a variety of high fiber foods rather than one type of fiber.   Try to increase your intake of fiber through using high fiber foods rather than fiber pills or supplements that contain small amounts of fiber.   The goal is to change the types of food eaten. Do not supplement your present diet with high fiber foods, but replace foods in your present diet.  INCLUDE A VARIETY OF FIBER SOURCES  Replace refined and processed grains with whole grains, canned fruits with fresh fruits, and incorporate other fiber sources. White rice, white breads, and most bakery goods contain little or no fiber.   Brown whole-grain rice, buckwheat oats, and many fruits and vegetables are all good sources of fiber. These include: broccoli, Brussels sprouts, cabbage, cauliflower, beets, sweet potatoes, white potatoes (skin on), carrots, tomatoes, eggplant, squash, berries, fresh fruits, and dried fruits.   Cereals appear to be the richest source of fiber. Cereal fiber is found in whole grains and bran. Bran is the fiber-rich outer coat of cereal grain, which  is largely removed in refining. In whole-grain cereals, the bran remains. In breakfast cereals, the largest amount of fiber is found in those with "bran" in their names. The fiber content is sometimes indicated on the label.   You may need to include additional fruits and vegetables each day.   In baking, for 1 cup white flour, you may use the following substitutions:   1 cup whole-wheat flour minus 2 tbs.    cup white flour plus  cup whole-wheat flour.  Document Released: 11/10/2005 Document Revised: 10/30/2011 Document Reviewed: 09/18/2009 [facility] Patient Information 2012 [place], [place].Hemorrhoids Hemorrhoids are enlarged (dilated) veins around the rectum. There are 2 types of hemorrhoids, and the type of hemorrhoid is determined by its location. Internal hemorrhoids occur in the veins just inside the rectum.They are usually not painful, but they may bleed.However, they may poke through to the outside and become irritated and painful. External hemorrhoids involve the veins outside the anus and can be felt as a painful swelling or hard lump near the anus.They are often itchy and may crack and bleed. Sometimes clots will form in the veins. This makes them swollen and painful. These are called thrombosed hemorrhoids. CAUSES Causes of hemorrhoids include:  Pregnancy. This increases the pressure in the hemorrhoidal veins.   Constipation.   Straining to have a bowel movement.   Obesity.   Heavy lifting or other activity that caused you to strain.  TREATMENT Most of the time hemorrhoids improve in 1 to 2 weeks. However, if symptoms do not seem to be getting better or if you have a lot of rectal bleeding, your caregiver may perform a procedure to help make the hemorrhoids get smaller or remove them completely.Possible treatments include:  Rubber band ligation. A rubber band is placed at the base of the hemorrhoid to cut off the circulation.   Sclerotherapy. A chemical is injected to  shrink the hemorrhoid.   Infrared light therapy. Tools are used to burn the hemorrhoid.   Hemorrhoidectomy. This is surgical removal of the hemorrhoid.  HOME CARE INSTRUCTIONS   Increase fiber in your diet. Ask your caregiver about using fiber supplements.   Drink enough water and fluids to keep your urine clear or pale yellow.   Exercise regularly.  Go to the bathroom when you have the urge to have a bowel movement. Do not wait.   Avoid straining to have bowel movements.   Keep the anal area dry and clean.   Only take over-the-counter or prescription medicines for pain, discomfort, or fever as directed by your caregiver.  If your hemorrhoids are thrombosed:  Take warm sitz baths for 20 to 30 minutes, 3 to 4 times per day.   If the hemorrhoids are very tender and swollen, place ice packs on the area as tolerated. Using ice packs between sitz baths may be helpful. Fill a plastic bag with ice. Place a towel between the bag of ice and your skin.   Medicated creams and suppositories may be used or applied as directed.   Do not use a donut-shaped pillow or sit on the toilet for long periods. This increases blood pooling and pain.  SEEK MEDICAL CARE IF:   You have increasing pain and swelling that is not controlled with your medicine.   You have uncontrolled bleeding.   You have difficulty or you are unable to have a bowel movement.   You have pain or inflammation outside the area of the hemorrhoids.   You have chills or an oral temperature above 102 F (38.9 C).  MAKE SURE YOU:   Understand these instructions.   Will watch your condition.   Will get help right away if you are not doing well or get worse.  Document Released: 11/07/2000 Document Revised: 10/30/2011 Document Reviewed: 03/14/2008 [facility] Patient Information 2012 [place], [place].

## 2012-04-26 ENCOUNTER — Encounter: Payer: Self-pay | Admitting: Internal Medicine

## 2012-05-28 ENCOUNTER — Ambulatory Visit: Payer: Medicaid Other | Admitting: Gastroenterology

## 2012-07-21 ENCOUNTER — Other Ambulatory Visit: Payer: Self-pay | Admitting: Obstetrics and Gynecology

## 2012-11-09 ENCOUNTER — Telehealth (INDEPENDENT_AMBULATORY_CARE_PROVIDER_SITE_OTHER): Payer: Self-pay | Admitting: General Surgery

## 2012-11-09 NOTE — Telephone Encounter (Signed)
Patient returned call and will contact Dr. Sherwood Gambler for their records to be faxed. Patient stated she has been doing everything she has been instructed in order to try to relieve the problem. Patient has fissures, internal and external hemorrhoids. Patient stated she has been using stool softeners, miralax, preparation H, baby wipes, tucks pads, witch hazel in addition to drinking plenty of water and eating natural fiber (oatmeal every morning). Patient requested call back if the information has not been received. I advised I will call her back on 11/15/12 to let her know. The patient agreed.

## 2012-11-09 NOTE — Telephone Encounter (Signed)
Called and left message for patient to call back to advise if notes from Dr. Sherwood Gambler can be faxed to [place] prior to appointment on 11/22/12.  There is nothing in epic from Dr. Sherwood Gambler and this patient is being seen 11/22/12.

## 2012-11-22 ENCOUNTER — Encounter (INDEPENDENT_AMBULATORY_CARE_PROVIDER_SITE_OTHER): Payer: Self-pay | Admitting: General Surgery

## 2012-11-22 ENCOUNTER — Ambulatory Visit (INDEPENDENT_AMBULATORY_CARE_PROVIDER_SITE_OTHER): Payer: 59 | Admitting: General Surgery

## 2012-11-22 VITALS — BP 130/92 | HR 118 | Temp 97.8°F | Ht 62.0 in | Wt 134.4 lb

## 2012-11-22 DIAGNOSIS — K648 Other hemorrhoids: Secondary | ICD-10-CM | POA: Insufficient documentation

## 2012-11-22 DIAGNOSIS — K644 Residual hemorrhoidal skin tags: Secondary | ICD-10-CM

## 2012-11-22 DIAGNOSIS — K602 Anal fissure, unspecified: Secondary | ICD-10-CM | POA: Insufficient documentation

## 2012-11-22 NOTE — Progress Notes (Signed)
Patient ID: Lacey Hicks, female   DOB: 1981/02/27, 31 y.o.   MRN: [id number]  Chief Complaint  Patient presents with  . Pre-op Exam    hems and fissures  . Rectal Problems    HPI Lacey Hicks is a 31 y.o. female.  She is referred by Dr. Jillene Bucks used to for evaluation of anal fissure and hemorrhoids.  This is a pleasant young woman. No major health problems. 3 cesarean sections in the past. Does take Percocet daily.  She's had problems with hemorrhoids for 10 years or more. She's had problems with painful bowel movements for about a year. She tried a variety of treatments, topical agents, stool softeners, laxatives, nitroglycerin ointment , she has seen Dr. Sherwood Gambler,  Dr. Malvin Johns, and one of the hemorrhoid disease clinics. She continues to be symptomatic. Despite the stool softeners and MiraLAX she still has an uncomfortable external hemorrhoid which  makes it hard to clean. She has a bowel movement there is a lot of pain immediately and for about an hour. She has some bleeding daily after bowel movements.  This is low volume. She sometimes has to strain. She admits to chronic constipation  Her husband is with her throughout the visit today. 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 Last pregnancy and delivery was 07/18/2010. Family history is negative colitis, ileitis, or colon cancer. She has never required a colonoscopy. HPI  Past Medical History  Diagnosis Date  . Hemorrhoids   . Constipation   . Migraines   . Urinary tract infection   . Arthritis   . Diabetes mellitus     Past Surgical History  Procedure Date  . Tonsillectomy   . Cesarean section 04/10/99,10/01/03,07/18/10  . Wisdom tooth extraction 1997    Family History  Problem Relation Age of Onset  . Heart disease    . Arthritis    . Diabetes    . Cervical cancer    . Breast cancer      Social History History  Substance Use Topics  . Smoking status: Former Smoker -- 2.0  packs/day for 10 years    Types: Cigarettes  . Smokeless tobacco: Former Neurosurgeon    Quit date: 08/01/2012  . Alcohol Use: Yes     Comment: social    Allergies  Allergen Reactions  . Wellbutrin (Bupropion Hcl) Swelling  . Cephalosporins Itching    Current Outpatient Prescriptions  Medication Sig Dispense Refill  . atomoxetine (STRATTERA) 80 MG capsule Take 80 mg by mouth daily.      . diazepam (VALIUM) 10 MG tablet Take 10 mg by mouth every 6 (six) hours as needed.      Marland Kitchen oxyCODONE-acetaminophen (PERCOCET) 7.5-325 MG per tablet Take 1 tablet by mouth every 4 (four) hours as needed.      . polyethylene glycol (MIRALAX / GLYCOLAX) packet Take 17 g by mouth daily.      Marland Kitchen senna-docusate (SENOKOT-S) 8.6-50 MG per tablet Take 1-2 tablets by mouth daily as needed for constipation.  30 tablet  1    Review of Systems Review of Systems  Constitutional: Negative for fever, chills and unexpected weight change.  HENT: Negative for hearing loss, congestion, sore throat, trouble swallowing and voice change.   Eyes: Negative for visual disturbance.  Respiratory: Negative for cough and wheezing.   Cardiovascular: Negative for chest pain, palpitations and leg swelling.  Gastrointestinal: Positive for constipation, blood in stool, anal bleeding and rectal pain. Negative for nausea, vomiting, abdominal  pain, diarrhea and abdominal distention.  Genitourinary: Negative for hematuria, vaginal bleeding and difficulty urinating.  Musculoskeletal: Negative for arthralgias.  Skin: Negative for rash and wound.  Neurological: Negative for seizures, syncope and headaches.  Hematological: Negative for adenopathy. Does not bruise/bleed easily.  Psychiatric/Behavioral: Negative for confusion.    Blood pressure 130/92, pulse 118, temperature 97.8 F (36.6 C), temperature source Temporal, height 5\' 2"  (1.575 m), weight 134 lb 6.4 oz (60.963 kg), SpO2 98.00%.  Physical Exam Physical Exam  Constitutional: She is  oriented to person, place, and time. She appears well-developed and well-nourished. No distress.  HENT:  Head: Normocephalic and atraumatic.  Nose: Nose normal.  Mouth/Throat: No oropharyngeal exudate.  Eyes: Conjunctivae normal and EOM are normal. Pupils are equal, round, and reactive to light. Left eye exhibits no discharge. No scleral icterus.  Neck: Neck supple. No JVD present. No tracheal deviation present. No thyromegaly present.  Cardiovascular: Normal rate, regular rhythm, normal heart sounds and intact distal pulses.   No murmur heard. Pulmonary/Chest: Effort normal and breath sounds normal. No respiratory distress. She has no wheezes. She has no rales. She exhibits no tenderness.  Abdominal: Soft. Bowel sounds are normal. She exhibits no distension and no mass. There is no tenderness. There is no rebound and no guarding.       Well healed Pfannenstiel incision   Genitourinary:       Large external hemorrhoid, left posterior, relatively soft, no thrombosis. Chronic anal fissure posterior midline, tender. Digital exam reveals and increased sphincter tone but good relaxation, no stenosis. Small endoscope inserted. Some internal hemorrhoids on the right side and left side were noted. Exam cause a little bit of bleeding from the fissure.  Musculoskeletal: She exhibits no edema and no tenderness.  Lymphadenopathy:    She has no cervical adenopathy.  Neurological: She is alert and oriented to person, place, and time. She exhibits normal muscle tone. Coordination normal.  Skin: Skin is warm. No rash noted. She is not diaphoretic. No erythema. No pallor.  Psychiatric: She has a normal mood and affect. Her behavior is normal. Judgment and thought content normal.    Data Reviewed No other data available  Assessment    Chronic anal fissure, posterior midline, symptomatic  Internal and external hemorrhoids, symptomatic  History cesarean section x3  Chronic constipation    Plan    A  long discussion with the patient and her husband. It appears that she has tried very hard to treat her hemorrhoids and anal fissure with medical therapy and this has failed. She was offered the option of surgical intervention. She would like to proceed with this  She'll be scheduled for examination under anesthesia, repair of anal fissure with internal sphincterotomy, and excision of internal and external hemorrhoids.  I discussed the indications, details, techniques, and numerous risks of the surgery with the patient and her husband. Disability issues and return to work issues were discussed. She understands these issues. Her questions are answered. She agrees with this plan.       Angelia Mould. Derrell Lolling, M.D., [facility] General and Minimally invasive Surgery Breast and Colorectal Surgery Office:   [phone] Pager:   [phone]  11/22/2012, 10:14 AM

## 2012-11-22 NOTE — Patient Instructions (Signed)
Your examination today reveals a chronic anal fissure in the posterior midline, and also reveals some external and internal hemorrhoids. Your  symptoms are consistent with both of these problems.  You'll be scheduled for surgery to repair the anal  fissure and perform a sphincterotomy, and to excise the enlarged hemorrhoids.     Anal Fissure, Adult An anal fissure is a small tear or crack in the skin around the anus. Bleeding from a fissure usually stops on its own within a few minutes. However, bleeding will often reoccur with each bowel movement until the crack heals.  CAUSES   Passing large, hard stools.  Frequent diarrheal stools.  Constipation.  Inflammatory bowel disease (Crohn's disease or ulcerative colitis).  Infections.  Anal sex. SYMPTOMS   Small amounts of blood seen on your stools, on toilet paper, or in the toilet after a bowel movement.  Rectal bleeding.  Painful bowel movements.  Itching or irritation around the anus. DIAGNOSIS Your caregiver will examine the anal area. An anal fissure can usually be seen with careful inspection. A rectal exam may be performed and a short tube (anoscope) may be used to examine the anal canal. TREATMENT   You may be instructed to take fiber supplements. These supplements can soften your stool to help make bowel movements easier.  Sitz baths may be recommended to help heal the tear. Do not use soap in the sitz baths.  A medicated cream or ointment may be prescribed to lessen discomfort. HOME CARE INSTRUCTIONS   Maintain a diet high in fruits, whole grains, and vegetables. Avoid constipating foods like bananas and dairy products.  Take sitz baths as directed by your caregiver.  Drink enough fluids to keep your urine clear or pale yellow.  Only take over-the-counter or prescription medicines for pain, discomfort, or fever as directed by your caregiver. Do not take aspirin as this may increase bleeding.  Do not use  ointments containing numbing medications (anesthetics) or hydrocortisone. They could slow healing. SEEK MEDICAL CARE IF:   Your fissure is not completely healed within 3 days.  You have further bleeding.  You have a fever.  You have diarrhea mixed with blood.  You have pain.  Your problem is getting worse rather than better. MAKE SURE YOU:   Understand these instructions.  Will watch your condition.  Will get help right away if you are not doing well or get worse. Document Released: 11/10/2005 Document Revised: 02/02/2012 Document Reviewed: 04/27/2011 [facility] Patient Information 2013 [place], [place].      Hemorrhoidectomy Hemorrhoidectomy is surgery to remove hemorrhoids. Hemorrhoids are veins that have become swollen in the rectum. The rectum is the area from the bottom end of the intestines to the opening where bowel movements leave the body. Hemorrhoids can be uncomfortable. They can cause itching, bleeding and pain if a blood clot forms in them (thrombose). If hemorrhoids are small, surgery may not be needed. But if they cover a larger area, surgery is usually suggested.  LET YOUR CAREGIVER KNOW ABOUT:   Any allergies.  All medications you are taking, including:  Herbs, eyedrops, over-the-counter medications and creams.  Blood thinners (anticoagulants), aspirin or other drugs that could affect blood clotting.  Use of steroids (by mouth or as creams).  Previous problems with anesthetics, including local anesthetics.  Possibility of pregnancy, if this applies.  Any history of blood clots.  Any history of bleeding or other blood problems.  Previous surgery.  Smoking history.  Other health problems. RISKS AND COMPLICATIONS  All surgery carries some risk. However, hemorrhoid surgery usually goes smoothly. Possible complications could include:  Urinary retention.  Bleeding.  Infection.  A painful incision.  A reaction to the anesthesia (this is not  common). BEFORE THE PROCEDURE   Stop using aspirin and non-steroidal anti-inflammatory drugs (NSAIDs) for pain relief. This includes prescription drugs and over-the-counter drugs such as ibuprofen and naproxen. Also stop taking vitamin E. If possible, do this two weeks before your surgery.  If you take blood-thinners, ask your healthcare provider when you should stop taking them.  You will probably have blood and urine tests done several days before your surgery.  Do not eat or drink for about 8 hours before the surgery.  Arrive at least an hour before the surgery, or whenever your surgeon recommends. This will give you time to check in and fill out any needed paperwork.  Hemorrhoidectomy is often an outpatient procedure. This means you will be able to go home the same day. Sometimes, though, people stay overnight in the hospital after the procedure. Ask your surgeon what to expect. Either way, make arrangements in advance for someone to drive you home. PROCEDURE   The preparation:  You will change into a hospital gown.  You will be given an IV. A needle will be inserted in your arm. Medication can flow directly into your body through this needle.  You might be given an enema to clear your rectum.  Once in the operating room, you will probably lie on your side or be repositioned later to lying on your stomach.  You will be given anesthesia (medication) so you will not feel anything during the surgery. The surgery often is done with local anesthesia (the area near the hemorrhoids will be numb and you will be drowsy but awake). Sometimes, general anesthesia is used (you will be asleep during the procedure).  The procedure:  There are a few different procedures for hemorrhoids. Be sure to ask you surgeon about the procedure, the risks and benefits.  Be sure to ask about what you need to do to take care of the wound, if there is one. AFTER THE PROCEDURE  You will stay in a recovery  area until the anesthesia has worn off. Your blood pressure and pulse will be checked every so often.  You may feel a lot of pain in the area of the rectum.  Take all pain medication prescribed by your surgeon. Ask before taking any over-the-counter pain medicines.  Sometimes sitting in a warm bath can help relieve your pain.  To make sure you have bowel movements without straining:  You will probably need to take stool softeners (usually a pill) for a few days.  You should drink 8 to 10 glasses of water each day.  Your activity will be restricted for awhile. Ask your caregiver for a list of what you should and should not do while you recover. Document Released: 09/07/2009 Document Revised: 02/02/2012 Document Reviewed: 09/07/2009 [facility] Patient Information 2013 [place], [place].

## 2012-11-24 HISTORY — PX: ANAL SPHINCTEROTOMY: SHX1140

## 2012-11-24 HISTORY — PX: HEMORROIDECTOMY: SUR656

## 2012-12-10 ENCOUNTER — Telehealth (INDEPENDENT_AMBULATORY_CARE_PROVIDER_SITE_OTHER): Payer: Self-pay | Admitting: General Surgery

## 2012-12-10 ENCOUNTER — Other Ambulatory Visit (INDEPENDENT_AMBULATORY_CARE_PROVIDER_SITE_OTHER): Payer: Self-pay | Admitting: General Surgery

## 2012-12-10 DIAGNOSIS — K649 Unspecified hemorrhoids: Secondary | ICD-10-CM

## 2012-12-10 DIAGNOSIS — K602 Anal fissure, unspecified: Secondary | ICD-10-CM

## 2012-12-10 NOTE — Telephone Encounter (Signed)
Pt returned call after 1 hour with new pad.  She is still bleeding; husband inspected the area and sees a continuous oozing.  Will update Dr. Derrell Lolling.

## 2012-12-10 NOTE — Telephone Encounter (Signed)
Discussed with Dr. Derrell Lolling:  Pt instructed to lay on stomach, place gauze to the site and then layer on an ice pack so that pressure is applied as well.  If bleeding cannot be controlled, she will go to [facility] ER.  Will check on her progress in one hour (approx 4:50pm.)

## 2012-12-10 NOTE — Telephone Encounter (Signed)
Pt called to report she just had surgery today; is home now and experienced a "gush" of bright red blood when she used the bathroom (to urinate.)  Pt not sure when to call it "excessive bleeding" as in her discharge instructions.  Advised her to replace pad and call back in 1 hour to assess if she is having continuous bleeding or not.  She understands and will call back at 3:00 to ask for me.

## 2012-12-10 NOTE — Telephone Encounter (Signed)
Called pt for one last check on her.  She is still on her stomach with the ice pack and husband cannot see any bleeding at this time.

## 2012-12-14 ENCOUNTER — Telehealth (INDEPENDENT_AMBULATORY_CARE_PROVIDER_SITE_OTHER): Payer: Self-pay | Admitting: General Surgery

## 2012-12-14 NOTE — Telephone Encounter (Signed)
Pt called to report an extremely foul smell since having her first postop BM.  She passed the Gelfoam at that time.  Bleeding was minimal, but the odor was/ is disturbing.  Explained the smell will dissipate and the pressure she feels is result of swelling.  Advised continue to use ice packs and warm tub soaks prn.  Pt will call back tomorrow with update.

## 2012-12-24 ENCOUNTER — Ambulatory Visit (INDEPENDENT_AMBULATORY_CARE_PROVIDER_SITE_OTHER): Payer: 59 | Admitting: General Surgery

## 2012-12-24 ENCOUNTER — Encounter (INDEPENDENT_AMBULATORY_CARE_PROVIDER_SITE_OTHER): Payer: Self-pay | Admitting: General Surgery

## 2012-12-24 VITALS — BP 104/60 | HR 82 | Temp 98.6°F | Resp 14 | Ht 62.0 in | Wt 134.4 lb

## 2012-12-24 DIAGNOSIS — K644 Residual hemorrhoidal skin tags: Secondary | ICD-10-CM

## 2012-12-24 DIAGNOSIS — K648 Other hemorrhoids: Secondary | ICD-10-CM

## 2012-12-24 DIAGNOSIS — K602 Anal fissure, unspecified: Secondary | ICD-10-CM

## 2012-12-24 NOTE — Patient Instructions (Signed)
All of your surgical wounds appear to be healing nicely. They have not completely healed. That is the reason you see a little bit of blood and have some itching.  Follow the instructions I gave you for controlling the itching.  To try to find the happy balance between diarrhea and constipation  Return to see Dr. Derrell Lolling in 2 months.

## 2012-12-24 NOTE — Progress Notes (Addendum)
Patient ID: Lacey Hicks, female   DOB: 06-26-1981, 32 y.o.   MRN: [id number]  History: This patient underwent examination under anesthesia, lateral internal sphincterotomy, excision of internal and external hemorrhoids, single column. Final pathology showed a fibroepithelial polyp, so the hemorrhoids were mostly due to a chronic hypertrophy of a sentinel tag. She has done reasonably well. Her pain is much better. Still spots a little bit of blood. Complains of itching. Otherwise doing fairly well and back to full activities.  Exam: Patient looks well. No distress Rectal exam shows small open wound where we excised the large polyp. This is healthy granulation tissue and appeared to be healing in well. No evidence of infection. No obvious dermatitis. No bleeding.  Assessment chronic anal fissure with hypertrophy sentinel tag, recovering slowly but uneventfully following operative repair  Plan: I gave her patient information booklet on pruritus ani and discussed the strategies for moisture control and controlling itching. This should be self limited. OK to use analpram HC cream. High-fiber low-fat diet. Try to avoid constipation and also to avoid a lot of diarrhea. Return to see me in 2 months to make sure that she is completely healed and there we are both satisfied that there are no problems.   Angelia Mould. Derrell Lolling, M.D., [facility] General and Minimally invasive Surgery Breast and Colorectal Surgery Office:   [phone] Pager:   [phone]

## 2013-02-22 ENCOUNTER — Encounter (INDEPENDENT_AMBULATORY_CARE_PROVIDER_SITE_OTHER): Payer: 59 | Admitting: General Surgery

## 2013-03-18 ENCOUNTER — Encounter (HOSPITAL_COMMUNITY): Payer: Self-pay | Admitting: Pharmacist

## 2013-03-30 ENCOUNTER — Encounter (HOSPITAL_COMMUNITY): Payer: Self-pay

## 2013-03-30 ENCOUNTER — Encounter (HOSPITAL_COMMUNITY)
Admission: RE | Admit: 2013-03-30 | Discharge: 2013-03-30 | Disposition: A | Discharge status: Home / Self Care | Payer: 59 | Source: Ambulatory Visit | Attending: Obstetrics and Gynecology | Admitting: Obstetrics and Gynecology

## 2013-03-30 LAB — CBC
Hemoglobin: 13 g/dL (ref 12.0–15.0)
MCHC: 34.8 g/dL (ref 30.0–36.0)
Platelets: 229 10*3/uL (ref 150–400)
RDW: 12 % (ref 11.5–15.5)

## 2013-03-30 LAB — SURGICAL PCR SCREEN
MRSA, PCR: NEGATIVE
Staphylococcus aureus: NEGATIVE

## 2013-03-30 NOTE — Patient Instructions (Addendum)
20 Lacey Hicks  03/30/2013   Your procedure is scheduled on:  04/14/13  Enter through the Main Entrance of [facility] at 6 AM.  Pick up the phone at the desk and dial [phone].   Call this number if you have problems the morning of surgery: [phone]   Remember:   Do not eat food:After Midnight.  Do not drink clear liquids: After Midnight.  Take these medicines the morning of surgery with A SIP OF WATER: Bring inhaler.   Do not wear jewelry, make-up or nail polish.  Do not wear lotions, powders, or perfumes. You may wear deodorant.  Do not shave 48 hours prior to surgery.  Do not bring valuables to the hospital.  Contacts, dentures or bridgework may not be worn into surgery.  Leave suitcase in the car. After surgery it may be brought to your room.  For patients admitted to the hospital, checkout time is 11:00 AM the day of discharge.   Patients discharged the day of surgery will not be allowed to drive home.  Name and phone number of your driver: NA  Special Instructions: Shower using CHG 2 nights before surgery and the night before surgery.  If you shower the day of surgery use CHG.  Use special wash - you have one bottle of CHG for all showers.  You should use approximately 1/3 of the bottle for each shower.   Please read over the following fact sheets that you were given: MRSA Information

## 2013-03-31 ENCOUNTER — Other Ambulatory Visit (HOSPITAL_COMMUNITY): Payer: Self-pay | Admitting: Physician Assistant

## 2013-03-31 DIAGNOSIS — R1013 Epigastric pain: Secondary | ICD-10-CM

## 2013-03-31 DIAGNOSIS — G8929 Other chronic pain: Secondary | ICD-10-CM

## 2013-03-31 DIAGNOSIS — K5900 Constipation, unspecified: Secondary | ICD-10-CM

## 2013-04-01 ENCOUNTER — Ambulatory Visit (HOSPITAL_COMMUNITY)
Admission: RE | Admit: 2013-04-01 | Discharge: 2013-04-01 | Disposition: A | Discharge status: Home / Self Care | Payer: 59 | Source: Ambulatory Visit | Attending: Physician Assistant | Admitting: Physician Assistant

## 2013-04-01 ENCOUNTER — Encounter (HOSPITAL_COMMUNITY): Payer: Self-pay | Admitting: *Deleted

## 2013-04-01 ENCOUNTER — Emergency Department (HOSPITAL_COMMUNITY)
Admission: EM | Admit: 2013-04-01 | Discharge: 2013-04-01 | Disposition: A | Discharge status: Home / Self Care | Payer: 59 | Attending: Emergency Medicine | Admitting: Emergency Medicine

## 2013-04-01 ENCOUNTER — Emergency Department (HOSPITAL_COMMUNITY): Payer: 59

## 2013-04-01 DIAGNOSIS — R1013 Epigastric pain: Secondary | ICD-10-CM

## 2013-04-01 DIAGNOSIS — K5900 Constipation, unspecified: Secondary | ICD-10-CM

## 2013-04-01 DIAGNOSIS — R109 Unspecified abdominal pain: Secondary | ICD-10-CM | POA: Insufficient documentation

## 2013-04-01 DIAGNOSIS — Z79899 Other long term (current) drug therapy: Secondary | ICD-10-CM | POA: Insufficient documentation

## 2013-04-01 DIAGNOSIS — G8929 Other chronic pain: Secondary | ICD-10-CM

## 2013-04-01 DIAGNOSIS — Z87891 Personal history of nicotine dependence: Secondary | ICD-10-CM | POA: Insufficient documentation

## 2013-04-01 DIAGNOSIS — Z9851 Tubal ligation status: Secondary | ICD-10-CM | POA: Insufficient documentation

## 2013-04-01 DIAGNOSIS — Z8679 Personal history of other diseases of the circulatory system: Secondary | ICD-10-CM | POA: Insufficient documentation

## 2013-04-01 DIAGNOSIS — M129 Arthropathy, unspecified: Secondary | ICD-10-CM | POA: Insufficient documentation

## 2013-04-01 DIAGNOSIS — Z8639 Personal history of other endocrine, nutritional and metabolic disease: Secondary | ICD-10-CM | POA: Insufficient documentation

## 2013-04-01 DIAGNOSIS — Z87440 Personal history of urinary (tract) infections: Secondary | ICD-10-CM | POA: Insufficient documentation

## 2013-04-01 DIAGNOSIS — R110 Nausea: Secondary | ICD-10-CM | POA: Insufficient documentation

## 2013-04-01 DIAGNOSIS — Z862 Personal history of diseases of the blood and blood-forming organs and certain disorders involving the immune mechanism: Secondary | ICD-10-CM | POA: Insufficient documentation

## 2013-04-01 DIAGNOSIS — Z3202 Encounter for pregnancy test, result negative: Secondary | ICD-10-CM | POA: Insufficient documentation

## 2013-04-01 LAB — CBC WITH DIFFERENTIAL/PLATELET
Basophils Absolute: 0 10*3/uL (ref 0.0–0.1)
Basophils Relative: 0 % (ref 0–1)
Eosinophils Absolute: 0.2 10*3/uL (ref 0.0–0.7)
Eosinophils Relative: 3 % (ref 0–5)
HCT: 36.1 % (ref 36.0–46.0)
Hemoglobin: 12.8 g/dL (ref 12.0–15.0)
Lymphocytes Relative: 41 % (ref 12–46)
Lymphs Abs: 2.8 10*3/uL (ref 0.7–4.0)
MCH: 32.2 pg (ref 26.0–34.0)
MCHC: 35.5 g/dL (ref 30.0–36.0)
MCV: 90.9 fL (ref 78.0–100.0)
Monocytes Absolute: 0.4 10*3/uL (ref 0.1–1.0)
Monocytes Relative: 6 % (ref 3–12)
Neutro Abs: 3.4 10*3/uL (ref 1.7–7.7)
Neutrophils Relative %: 50 % (ref 43–77)
Platelets: 219 10*3/uL (ref 150–400)
RBC: 3.97 MIL/uL (ref 3.87–5.11)
RDW: 11.6 % (ref 11.5–15.5)
WBC: 6.8 10*3/uL (ref 4.0–10.5)

## 2013-04-01 LAB — URINALYSIS, ROUTINE W REFLEX MICROSCOPIC
Bilirubin Urine: NEGATIVE
Ketones, ur: NEGATIVE mg/dL
Nitrite: NEGATIVE
Protein, ur: NEGATIVE mg/dL
Specific Gravity, Urine: 1.025 (ref 1.005–1.030)
Urobilinogen, UA: 0.2 mg/dL (ref 0.0–1.0)

## 2013-04-01 LAB — COMPREHENSIVE METABOLIC PANEL
ALT: 20 U/L (ref 0–35)
AST: 19 U/L (ref 0–37)
Albumin: 3.9 g/dL (ref 3.5–5.2)
Alkaline Phosphatase: 66 U/L (ref 39–117)
BUN: 13 mg/dL (ref 6–23)
CO2: 24 mEq/L (ref 19–32)
Calcium: 9.1 mg/dL (ref 8.4–10.5)
Chloride: 101 mEq/L (ref 96–112)
Creatinine, Ser: 0.7 mg/dL (ref 0.50–1.10)
GFR calc Af Amer: >90 mL/min (ref >90)
GFR calc non Af Amer: >90 mL/min (ref >90)
Glucose, Bld: 136 mg/dL — ABNORMAL HIGH (ref 70–99)
Potassium: 3.8 mEq/L (ref 3.5–5.1)
Sodium: 138 mEq/L (ref 135–145)
Total Bilirubin: 0.2 mg/dL — ABNORMAL LOW (ref 0.3–1.2)
Total Protein: 6.9 g/dL (ref 6.0–8.3)

## 2013-04-01 LAB — LIPASE, BLOOD: Lipase: 25 U/L (ref 11–59)

## 2013-04-01 MED ORDER — MORPHINE SULFATE 4 MG/ML IJ SOLN
4.0000 mg | Freq: Once | INTRAMUSCULAR | Status: AC
  Administered 2013-04-01: 4 mg via INTRAVENOUS
  Filled 2013-04-01: qty 1

## 2013-04-01 MED ORDER — SODIUM CHLORIDE 0.9 % IV BOLUS (SEPSIS)
1000.0000 mL | Freq: Once | INTRAVENOUS | Status: AC
  Administered 2013-04-01: 1000 mL via INTRAVENOUS

## 2013-04-01 MED ORDER — IOHEXOL 300 MG/ML  SOLN
100.0000 mL | Freq: Once | INTRAMUSCULAR | Status: AC | PRN
  Administered 2013-04-01: 100 mL via INTRAVENOUS

## 2013-04-01 MED ORDER — ONDANSETRON HCL 4 MG/2ML IJ SOLN
4.0000 mg | Freq: Once | INTRAMUSCULAR | Status: AC
  Administered 2013-04-01: 4 mg via INTRAVENOUS
  Filled 2013-04-01: qty 2

## 2013-04-01 MED ORDER — IOHEXOL 300 MG/ML  SOLN
50.0000 mL | Freq: Once | INTRAMUSCULAR | Status: AC | PRN
  Administered 2013-04-01: 50 mL via ORAL

## 2013-04-01 MED ORDER — DICYCLOMINE HCL 10 MG PO CAPS
20.0000 mg | ORAL_CAPSULE | Freq: Once | ORAL | Status: AC
  Administered 2013-04-01: 20 mg via ORAL
  Filled 2013-04-01: qty 2

## 2013-04-01 NOTE — ED Provider Notes (Signed)
History    32 year old female with abdominal pain. Intermittent. Start up 2 months ago. Pain is epigastric to left upper quadrant. Patient has not noticed any appreciable exacerbating relieving factors. Possibly worse shortly after eating, but not consistently. Pain radiates through to her back. He appears or chills. Mild nausea but no vomiting. No urinary complaints. No unusual vaginal bleeding or discharge. Patient had an ultrasound of her abdomen today which is unremarkable.  CSN: [id number]  Arrival date & time 04/01/13  1850   First MD Initiated Contact with Patient 04/01/13 1902      Chief Complaint  Patient presents with  . Abdominal Pain    (Consider location/radiation/quality/duration/timing/severity/associated sxs/prior treatment) HPI  Past Medical History  Diagnosis Date  . Hemorrhoids   . Constipation   . Migraines   . Urinary tract infection   . Arthritis   . Diabetes mellitus     "borderline"    Past Surgical History  Procedure Laterality Date  . Tonsillectomy    . Cesarean section  04/10/99,10/01/03,07/18/10  . Wisdom tooth extraction  1997  . Hemorroidectomy    . Anal fisure    . Sphinterotomy    . Tubal ligation      Family History  Problem Relation Age of Onset  . Heart disease    . Arthritis    . Diabetes    . Cervical cancer    . Breast cancer      History  Substance Use Topics  . Smoking status: Former Smoker -- 2.00 packs/day for 10 years    Types: Cigarettes  . Smokeless tobacco: Former Neurosurgeon    Quit date: 08/01/2012  . Alcohol Use: Yes     Comment: social    OB History   Grav Para Term Preterm Abortions TAB SAB Ect Mult Living                  Review of Systems  All systems reviewed and negative, other than as noted in HPI.   Allergies  Other; Wellbutrin; and Cephalosporins  Home Medications   Current Outpatient Rx  Name  Route  Sig  Dispense  Refill  . diazepam (VALIUM) 10 MG tablet   Oral   Take 10 mg by mouth every  6 (six) hours as needed (muscle spasms in neck).          . docusate sodium (COLACE) 100 MG capsule   Oral   Take 600 mg by mouth daily as needed for constipation.         Marland Kitchen lubiprostone (AMITIZA) 24 MCG capsule   Oral   Take 24 mcg by mouth daily.         Marland Kitchen OVER THE COUNTER MEDICATION   Oral   Take 3 capsules by mouth 3 (three) times daily before meals. 7-KETO [employer]AN: [employer]         . oxyCODONE-acetaminophen (PERCOCET) 10-325 MG per tablet   Oral   Take 1 tablet by mouth every 4 (four) hours as needed for pain.         Marland Kitchen albuterol (PROVENTIL HFA;VENTOLIN HFA) 108 (90 BASE) MCG/ACT inhaler   Inhalation   Inhale 2 puffs into the lungs every 6 (six) hours as needed for wheezing.           BP 130/72  Pulse 86  Temp(Src) 98.3 F (36.8 C) (Oral)  Resp 20  Ht 5\' 2"  (1.575 m)  Wt 142 lb (64.411 kg)  BMI 25.97 kg/m2  SpO2 100%  LMP 03/26/2013  Physical Exam  Nursing note and vitals reviewed. Constitutional: She appears well-developed and well-nourished. No distress.  HENT:  Head: Normocephalic and atraumatic.  Eyes: Conjunctivae are normal. Right eye exhibits no discharge. Left eye exhibits no discharge.  Neck: Neck supple.  Cardiovascular: Normal rate, regular rhythm and normal heart sounds.  Exam reveals no gallop and no friction rub.   No murmur heard. Pulmonary/Chest: Effort normal and breath sounds normal. No respiratory distress.  Abdominal: Soft. She exhibits no distension. There is tenderness.   Epigastric and left upper quadrant tenderness without rebound or guarding. No distention.  Musculoskeletal: She exhibits no edema and no tenderness.  Neurological: She is alert.  Skin: Skin is warm and dry.  Psychiatric: She has a normal mood and affect. Her behavior is normal. Thought content normal.    ED Course  Procedures (including critical care time)  Labs Reviewed  COMPREHENSIVE METABOLIC PANEL - Abnormal; Notable for the following:     Glucose, Bld 136 (*)    Total Bilirubin 0.2 (*)    All other components within normal limits  URINALYSIS, ROUTINE W REFLEX MICROSCOPIC  PREGNANCY, URINE  LIPASE, BLOOD  CBC WITH DIFFERENTIAL   US Abdomen Complete  04/01/2013  *RADIOLOGY REPORT*  Clinical Data:  Epigastric abdominal pain, left greater than right  ABDOMINAL ULTRASOUND COMPLETE  Comparison:  None.  Findings:  Gallbladder:  No gallstones, gallbladder wall thickening, or pericholecystic fluid.  Common Bile Duct:  There is fusiform prominence at the midportion of the common duct measuring 8 mm.  The technologist questioned echogenic material within the duct, but on the provided images, this appears to be related to probable volume averaging with the traversing hepatic artery.  Liver: No focal mass lesion identified.  Within normal limits in parenchymal echogenicity.  IVC:  Appears normal.  Pancreas:  No abnormality identified.  Spleen:  Within normal limits in size and echotexture.  Right kidney:  Normal sized.  No hydronephrosis or focal mass. Mild cortical thinning noted.  Mildly increased echogenicity.  Left kidney:  Normal size.  No hydronephrosis or focal mass.  Mild cortical thinning noted.  Mildly increased echogenicity.  Abdominal Aorta:  No aneurysm identified.  IMPRESSION: Mild bilateral renal cortical thinning and increased echogenicity which may suggest medical renal disease.  Fusiform prominence of the common duct with probable crossing vessel but no visualized filling defect to suggest choledocholithiasis on the provided images.   Original Report Authenticated By: Christiana Pellant, M.D.      1. Abdominal pain   2. Obstipation       MDM          Raeford Razor, MD 04/05/13 1445

## 2013-04-01 NOTE — ED Notes (Signed)
abd pain, nausea, Had abd U/S done today , but does not know results.

## 2013-04-13 NOTE — H&P (Signed)
32 year old G3 P3 presents for LAVH. She reports passing clots with menses the size of a pancake. She has been bleeding through her clothes, and at work she bled through her clothes to her knees and had to go the drugstore to buy new clothes and during that trip she soaked though more clothes. She has had a BTL. Ultrasound on April 10 revealed small 8 x 8 mm endometrial polyp  Medical History: History of Abnormal PAP Headaches  Surgical History : C Section x 3  BTL Tonsillectomy Hemorrhoidectomy  Medications: Percocet prn pain Valium prn anxiety  ALLERGIES: Almond Pecan  Walnut wellbutrin  Family History: Heart disease Diabetes Hypertension Uterine cancer  ROS Above  Social history Married No tobacco, alcohol   Afebrile VSS General alert and oriented Lung CTAB Car RRR Abdomen is soft and non tender Pelvic uterus is small and non tender No adnexal masses  IMPRESSION: Menorrhagia Endometrial polyp  PLAN: LAVH Risks of bladder, bowel injury discussed Risks of anesthesia discussed Risks of infection discussed Risks of thromboembolism discussed

## 2013-04-14 ENCOUNTER — Encounter (HOSPITAL_COMMUNITY): Payer: Self-pay | Admitting: Anesthesiology

## 2013-04-14 ENCOUNTER — Ambulatory Visit (HOSPITAL_COMMUNITY): Payer: 59 | Admitting: Anesthesiology

## 2013-04-14 ENCOUNTER — Encounter (HOSPITAL_COMMUNITY)
Admission: RE | Disposition: A | Discharge status: Home / Self Care | Payer: Self-pay | Source: Ambulatory Visit | Attending: Obstetrics and Gynecology

## 2013-04-14 ENCOUNTER — Observation Stay (HOSPITAL_COMMUNITY)
Admission: RE | Admit: 2013-04-14 | Discharge: 2013-04-16 | Disposition: A | Discharge status: Home / Self Care | Payer: 59 | Source: Ambulatory Visit | Attending: Obstetrics and Gynecology | Admitting: Obstetrics and Gynecology

## 2013-04-14 DIAGNOSIS — N3289 Other specified disorders of bladder: Secondary | ICD-10-CM | POA: Insufficient documentation

## 2013-04-14 DIAGNOSIS — N840 Polyp of corpus uteri: Secondary | ICD-10-CM | POA: Insufficient documentation

## 2013-04-14 DIAGNOSIS — K602 Anal fissure, unspecified: Secondary | ICD-10-CM

## 2013-04-14 DIAGNOSIS — Z90710 Acquired absence of both cervix and uterus: Secondary | ICD-10-CM

## 2013-04-14 DIAGNOSIS — K648 Other hemorrhoids: Secondary | ICD-10-CM

## 2013-04-14 DIAGNOSIS — N920 Excessive and frequent menstruation with regular cycle: Principal | ICD-10-CM | POA: Insufficient documentation

## 2013-04-14 DIAGNOSIS — K644 Residual hemorrhoidal skin tags: Secondary | ICD-10-CM

## 2013-04-14 HISTORY — PX: LAPAROSCOPIC ASSISTED VAGINAL HYSTERECTOMY: SHX5398

## 2013-04-14 SURGERY — HYSTERECTOMY, VAGINAL, LAPAROSCOPY-ASSISTED
Anesthesia: General | Site: Abdomen | Wound class: Clean Contaminated

## 2013-04-14 MED ORDER — TEMAZEPAM 15 MG PO CAPS: 15.0000 mg | ORAL_CAPSULE | Freq: Every evening | ORAL | Status: DC | PRN

## 2013-04-14 MED ORDER — GLYCOPYRROLATE 0.2 MG/ML IJ SOLN
INTRAMUSCULAR | Status: AC
  Filled 2013-04-14: qty 3

## 2013-04-14 MED ORDER — LACTATED RINGERS IV SOLN
INTRAVENOUS | Status: DC
  Administered 2013-04-14: 10:00:00 via INTRAVENOUS

## 2013-04-14 MED ORDER — INDIGOTINDISULFONATE SODIUM 8 MG/ML IJ SOLN
INTRAMUSCULAR | Status: DC | PRN
  Administered 2013-04-14: 40 mg via INTRAVENOUS

## 2013-04-14 MED ORDER — ACETAMINOPHEN 10 MG/ML IV SOLN
INTRAVENOUS | Status: AC
  Filled 2013-04-14: qty 100

## 2013-04-14 MED ORDER — KETOROLAC TROMETHAMINE 30 MG/ML IJ SOLN: 15.0000 mg | Freq: Once | INTRAMUSCULAR | Status: DC | PRN

## 2013-04-14 MED ORDER — FENTANYL CITRATE 0.05 MG/ML IJ SOLN
25.0000 ug | INTRAMUSCULAR | Status: DC | PRN
  Administered 2013-04-14: 50 ug via INTRAVENOUS

## 2013-04-14 MED ORDER — FENTANYL CITRATE 0.05 MG/ML IJ SOLN
INTRAMUSCULAR | Status: DC | PRN
  Administered 2013-04-14: 50 ug via INTRAVENOUS
  Administered 2013-04-14: 100 ug via INTRAVENOUS
  Administered 2013-04-14 (×3): 50 ug via INTRAVENOUS
  Administered 2013-04-14 (×2): 100 ug via INTRAVENOUS
  Administered 2013-04-14: 50 ug via INTRAVENOUS

## 2013-04-14 MED ORDER — LACTATED RINGERS IR SOLN
Status: DC | PRN
  Administered 2013-04-14: 3000 mL

## 2013-04-14 MED ORDER — CEFAZOLIN SODIUM-DEXTROSE 2-3 GM-% IV SOLR
2.0000 g | INTRAVENOUS | Status: AC
  Administered 2013-04-14: 2 g via INTRAVENOUS

## 2013-04-14 MED ORDER — PROPOFOL 10 MG/ML IV EMUL
INTRAVENOUS | Status: AC
  Filled 2013-04-14: qty 20

## 2013-04-14 MED ORDER — NEOSTIGMINE METHYLSULFATE 1 MG/ML IJ SOLN
INTRAMUSCULAR | Status: DC | PRN
  Administered 2013-04-14: 3 mg via INTRAVENOUS

## 2013-04-14 MED ORDER — FENTANYL CITRATE 0.05 MG/ML IJ SOLN
INTRAMUSCULAR | Status: AC
  Filled 2013-04-14: qty 5

## 2013-04-14 MED ORDER — ONDANSETRON HCL 4 MG/2ML IJ SOLN
INTRAMUSCULAR | Status: AC
  Filled 2013-04-14: qty 2

## 2013-04-14 MED ORDER — FENTANYL CITRATE 0.05 MG/ML IJ SOLN
INTRAMUSCULAR | Status: AC
  Administered 2013-04-14: 50 ug via INTRAVENOUS
  Filled 2013-04-14: qty 2

## 2013-04-14 MED ORDER — DEXTROSE IN LACTATED RINGERS 5 % IV SOLN
INTRAVENOUS | Status: DC
  Administered 2013-04-14 – 2013-04-15 (×2): via INTRAVENOUS

## 2013-04-14 MED ORDER — NALOXONE HCL 0.4 MG/ML IJ SOLN: 0.4000 mg | INTRAMUSCULAR | Status: DC | PRN

## 2013-04-14 MED ORDER — GLYCOPYRROLATE 0.2 MG/ML IJ SOLN
INTRAMUSCULAR | Status: DC | PRN
  Administered 2013-04-14: 0.6 mg via INTRAVENOUS

## 2013-04-14 MED ORDER — HYDROMORPHONE 0.3 MG/ML IV SOLN
INTRAVENOUS | Status: DC
  Administered 2013-04-14 (×2): via INTRAVENOUS
  Administered 2013-04-14: 3.39 mg via INTRAVENOUS
  Administered 2013-04-14: 30 mL via INTRAVENOUS
  Administered 2013-04-15: 04:00:00 via INTRAVENOUS
  Administered 2013-04-15: 5.63 mg via INTRAVENOUS
  Administered 2013-04-15: 5.77 mg via INTRAVENOUS
  Filled 2013-04-14 (×3): qty 25

## 2013-04-14 MED ORDER — ONDANSETRON HCL 4 MG/2ML IJ SOLN
INTRAMUSCULAR | Status: DC | PRN
  Administered 2013-04-14: 4 mg via INTRAVENOUS

## 2013-04-14 MED ORDER — MIDAZOLAM HCL 2 MG/2ML IJ SOLN
INTRAMUSCULAR | Status: AC
  Filled 2013-04-14: qty 2

## 2013-04-14 MED ORDER — MIDAZOLAM HCL 5 MG/5ML IJ SOLN
INTRAMUSCULAR | Status: DC | PRN
  Administered 2013-04-14: 2 mg via INTRAVENOUS

## 2013-04-14 MED ORDER — INDIGOTINDISULFONATE SODIUM 8 MG/ML IJ SOLN
INTRAMUSCULAR | Status: AC
  Filled 2013-04-14: qty 5

## 2013-04-14 MED ORDER — CEFAZOLIN SODIUM-DEXTROSE 2-3 GM-% IV SOLR
INTRAVENOUS | Status: AC
  Filled 2013-04-14: qty 50

## 2013-04-14 MED ORDER — LACTATED RINGERS IV SOLN
INTRAVENOUS | Status: DC
  Administered 2013-04-14 (×2): via INTRAVENOUS

## 2013-04-14 MED ORDER — NEOSTIGMINE METHYLSULFATE 1 MG/ML IJ SOLN
INTRAMUSCULAR | Status: AC
  Filled 2013-04-14: qty 1

## 2013-04-14 MED ORDER — KETOROLAC TROMETHAMINE 30 MG/ML IJ SOLN: 30.0000 mg | Freq: Once | INTRAMUSCULAR | Status: AC

## 2013-04-14 MED ORDER — DIAZEPAM 2 MG PO TABS
10.0000 mg | ORAL_TABLET | Freq: Four times a day (QID) | ORAL | Status: DC | PRN
  Administered 2013-04-15: 10 mg via ORAL
  Filled 2013-04-14: qty 5

## 2013-04-14 MED ORDER — SODIUM CHLORIDE 0.9 % IJ SOLN
INTRAMUSCULAR | Status: DC | PRN
  Administered 2013-04-14: 7 mL

## 2013-04-14 MED ORDER — PROMETHAZINE HCL 25 MG/ML IJ SOLN: 6.2500 mg | INTRAMUSCULAR | Status: DC | PRN

## 2013-04-14 MED ORDER — ONDANSETRON HCL 4 MG/2ML IJ SOLN
4.0000 mg | Freq: Four times a day (QID) | INTRAMUSCULAR | Status: DC | PRN
  Administered 2013-04-14 – 2013-04-15 (×2): 4 mg via INTRAVENOUS
  Filled 2013-04-14 (×2): qty 2

## 2013-04-14 MED ORDER — PROPOFOL 10 MG/ML IV BOLUS
INTRAVENOUS | Status: DC | PRN
  Administered 2013-04-14: 200 ug via INTRAVENOUS

## 2013-04-14 MED ORDER — ROCURONIUM BROMIDE 100 MG/10ML IV SOLN
INTRAVENOUS | Status: DC | PRN
  Administered 2013-04-14: 35 mg via INTRAVENOUS
  Administered 2013-04-14: 5 mg via INTRAVENOUS

## 2013-04-14 MED ORDER — ALBUTEROL SULFATE HFA 108 (90 BASE) MCG/ACT IN AERS: 2.0000 | INHALATION_SPRAY | Freq: Four times a day (QID) | RESPIRATORY_TRACT | Status: DC | PRN

## 2013-04-14 MED ORDER — FENTANYL CITRATE 0.05 MG/ML IJ SOLN
INTRAMUSCULAR | Status: AC
  Filled 2013-04-14: qty 2

## 2013-04-14 MED ORDER — LIDOCAINE HCL (CARDIAC) 20 MG/ML IV SOLN
INTRAVENOUS | Status: AC
  Filled 2013-04-14: qty 5

## 2013-04-14 MED ORDER — DIPHENHYDRAMINE HCL 12.5 MG/5ML PO ELIX: 12.5000 mg | ORAL_SOLUTION | Freq: Four times a day (QID) | ORAL | Status: DC | PRN

## 2013-04-14 MED ORDER — MIDAZOLAM HCL 2 MG/2ML IJ SOLN: 0.5000 mg | Freq: Once | INTRAMUSCULAR | Status: DC | PRN

## 2013-04-14 MED ORDER — BUPIVACAINE HCL (PF) 0.25 % IJ SOLN
INTRAMUSCULAR | Status: DC | PRN
  Administered 2013-04-14: 10 mL

## 2013-04-14 MED ORDER — MEPERIDINE HCL 25 MG/ML IJ SOLN: 6.2500 mg | INTRAMUSCULAR | Status: DC | PRN

## 2013-04-14 MED ORDER — BUPIVACAINE HCL (PF) 0.25 % IJ SOLN
INTRAMUSCULAR | Status: AC
  Filled 2013-04-14: qty 30

## 2013-04-14 MED ORDER — TRAMADOL HCL 50 MG PO TABS
50.0000 mg | ORAL_TABLET | Freq: Four times a day (QID) | ORAL | Status: DC | PRN
  Administered 2013-04-15 (×2): 50 mg via ORAL
  Filled 2013-04-14 (×2): qty 1

## 2013-04-14 MED ORDER — SODIUM CHLORIDE 0.9 % IJ SOLN
9.0000 mL | INTRAMUSCULAR | Status: DC | PRN
  Administered 2013-04-15: 3 mL via INTRAVENOUS

## 2013-04-14 MED ORDER — DEXAMETHASONE SODIUM PHOSPHATE 4 MG/ML IJ SOLN
INTRAMUSCULAR | Status: DC | PRN
  Administered 2013-04-14: 10 mg via INTRAVENOUS

## 2013-04-14 MED ORDER — KETOROLAC TROMETHAMINE 30 MG/ML IJ SOLN
INTRAMUSCULAR | Status: AC
  Administered 2013-04-14: 30 mg via INTRAVENOUS
  Filled 2013-04-14: qty 1

## 2013-04-14 MED ORDER — MENTHOL 3 MG MT LOZG
1.0000 | LOZENGE | OROMUCOSAL | Status: DC | PRN
  Administered 2013-04-15: 3 mg via ORAL
  Filled 2013-04-14: qty 9

## 2013-04-14 MED ORDER — IBUPROFEN 600 MG PO TABS
600.0000 mg | ORAL_TABLET | Freq: Four times a day (QID) | ORAL | Status: DC | PRN
  Administered 2013-04-15: 600 mg via ORAL
  Filled 2013-04-14: qty 1

## 2013-04-14 MED ORDER — ACETAMINOPHEN 10 MG/ML IV SOLN
1000.0000 mg | Freq: Four times a day (QID) | INTRAVENOUS | Status: DC
  Administered 2013-04-14: 1000 mg via INTRAVENOUS

## 2013-04-14 MED ORDER — DEXAMETHASONE SODIUM PHOSPHATE 10 MG/ML IJ SOLN
INTRAMUSCULAR | Status: AC
  Filled 2013-04-14: qty 1

## 2013-04-14 MED ORDER — LIDOCAINE HCL (CARDIAC) 20 MG/ML IV SOLN
INTRAVENOUS | Status: DC | PRN
  Administered 2013-04-14: 20 mg via INTRAVENOUS
  Administered 2013-04-14: 50 mg via INTRAVENOUS
  Administered 2013-04-14: 30 mg via INTRAVENOUS

## 2013-04-14 MED ORDER — DIPHENHYDRAMINE HCL 50 MG/ML IJ SOLN: 12.5000 mg | Freq: Four times a day (QID) | INTRAMUSCULAR | Status: DC | PRN

## 2013-04-14 MED ORDER — ROCURONIUM BROMIDE 50 MG/5ML IV SOLN
INTRAVENOUS | Status: AC
  Filled 2013-04-14: qty 1

## 2013-04-14 SURGICAL SUPPLY — 43 items
ADH SKN CLS APL DERMABOND .7 (GAUZE/BANDAGES/DRESSINGS) ×1
CHLORAPREP W/TINT 26ML (MISCELLANEOUS) ×2 IMPLANT
CLOTH BEACON ORANGE TIMEOUT ST (SAFETY) ×2 IMPLANT
CONT PATH 16OZ SNAP LID 3702 (MISCELLANEOUS) ×2 IMPLANT
COVER TABLE BACK 60X90 (DRAPES) ×2 IMPLANT
DECANTER SPIKE VIAL GLASS SM (MISCELLANEOUS) ×2 IMPLANT
DERMABOND ADVANCED (GAUZE/BANDAGES/DRESSINGS) ×1
DERMABOND ADVANCED .7 DNX12 (GAUZE/BANDAGES/DRESSINGS) ×1 IMPLANT
ELECT REM PT RETURN 9FT ADLT (ELECTROSURGICAL) ×2
ELECTRODE REM PT RTRN 9FT ADLT (ELECTROSURGICAL) ×1 IMPLANT
EVACUATOR SMOKE 8.L (FILTER) ×1 IMPLANT
GLOVE BIO SURGEON STRL SZ 6.5 (GLOVE) ×2 IMPLANT
GLOVE BIO SURGEON STRL SZ7.5 (GLOVE) ×2 IMPLANT
GLOVE BIOGEL PI IND STRL 6.5 (GLOVE) ×1 IMPLANT
GLOVE BIOGEL PI IND STRL 7.0 (GLOVE) IMPLANT
GLOVE BIOGEL PI INDICATOR 6.5 (GLOVE) ×1
GLOVE BIOGEL PI INDICATOR 7.0 (GLOVE) ×7
GLOVE NEODERM STER SZ 7 (GLOVE) ×2 IMPLANT
GLOVE SURG SS PI 6.5 STRL IVOR (GLOVE) ×1 IMPLANT
GLOVE SURG SS PI 7.0 STRL IVOR (GLOVE) ×1 IMPLANT
GLOVE SURG SS PI 7.5 STRL IVOR (GLOVE) ×1 IMPLANT
HEMOSTAT SURGICEL 2X3 (HEMOSTASIS) ×1 IMPLANT
HEMOSTAT SURGICEL 4X8 (HEMOSTASIS) ×1 IMPLANT
NEEDLE INSUFFLATION 120MM (ENDOMECHANICALS) ×2 IMPLANT
NS IRRIG 1000ML POUR BTL (IV SOLUTION) ×3 IMPLANT
PACK LAVH (CUSTOM PROCEDURE TRAY) ×2 IMPLANT
PAD OB MATERNITY 4.3X12.25 (PERSONAL CARE ITEMS) ×1 IMPLANT
PROTECTOR NERVE ULNAR (MISCELLANEOUS) ×2 IMPLANT
SEALER TISSUE G2 CVD JAW 45CM (ENDOMECHANICALS) ×2 IMPLANT
SET IRRIG TUBING LAPAROSCOPIC (IRRIGATION / IRRIGATOR) ×1 IMPLANT
SOLUTION ELECTROLUBE (MISCELLANEOUS) ×1 IMPLANT
SUT VIC AB 0 CT1 18XCR BRD8 (SUTURE) ×2 IMPLANT
SUT VIC AB 0 CT1 36 (SUTURE) ×6 IMPLANT
SUT VIC AB 0 CT1 8-18 (SUTURE) ×4
SUT VIC AB 3-0 PS2 18 (SUTURE)
SUT VIC AB 3-0 PS2 18XBRD (SUTURE) IMPLANT
SUT VICRYL 0 TIES 12 18 (SUTURE) ×2 IMPLANT
SUT VICRYL 0 UR6 27IN ABS (SUTURE) ×2 IMPLANT
TOWEL OR 17X24 6PK STRL BLUE (TOWEL DISPOSABLE) ×5 IMPLANT
TRAY FOLEY CATH 14FR (SET/KITS/TRAYS/PACK) ×2 IMPLANT
TROCAR OPTI TIP 5M 100M (ENDOMECHANICALS) ×2 IMPLANT
TROCAR XCEL DIL TIP R 11M (ENDOMECHANICALS) ×2 IMPLANT
WARMER LAPAROSCOPE (MISCELLANEOUS) ×2 IMPLANT

## 2013-04-14 NOTE — Brief Op Note (Signed)
04/14/2013  8:55 AM  PATIENT:  Lacey Hicks  32 y.o. female  PRE-OPERATIVE DIAGNOSIS:  MENORRHAGIA   POST-OPERATIVE DIAGNOSIS:  MENORRHAGIA  PROCEDURE:  Procedure(s): LAPAROSCOPIC ASSISTED VAGINAL HYSTERECTOMY (N/A)  SURGEON:  Surgeon(s) and Role:    * Jeani Hawking, MD - Primary    * W Varney Baas, MD - Assisting  PHYSICIAN ASSISTANT:   ASSISTANTS: none   ANESTHESIA:   general  EBL:  Total I/O In: 1700 [I.V.:1700] Out: 550 [Urine:300; Blood:250]  BLOOD ADMINISTERED:none  DRAINS: Urinary Catheter (Foley)   LOCAL MEDICATIONS USED:  MARCAINE     SPECIMEN:  Source of Specimen:  uterus and cervix  DISPOSITION OF SPECIMEN:  PATHOLOGY  COUNTS:  YES  TOURNIQUET:  * No tourniquets in log *  DICTATION: .Other Dictation: Dictation Number [id number]  PLAN OF CARE: Admit for overnight observation  PATIENT DISPOSITION:  PACU - hemodynamically stable.   Delay start of Pharmacological VTE agent (>24hrs) due to surgical blood loss or risk of bleeding: not applicable

## 2013-04-14 NOTE — OR Nursing (Signed)
Pt position check and assessed for proper placement including upper and lower extremities.  Pt position appropriate.

## 2013-04-14 NOTE — Progress Notes (Signed)
H and P on the chart. No significant changes Will proceed with LAVH Consent signed.

## 2013-04-14 NOTE — Anesthesia Preprocedure Evaluation (Signed)
Anesthesia Evaluation  Patient identified by MRN, date of birth, ID band Patient awake    Reviewed: Allergy & Precautions, H&P , Patient's Chart, lab work & pertinent test results, reviewed documented beta blocker date and time   History of Anesthesia Complications Negative for: history of anesthetic complications  Airway Mallampati: II TM Distance: >3 FB Neck ROM: full    Dental no notable dental hx.    Pulmonary neg pulmonary ROS,  breath sounds clear to auscultation  Pulmonary exam normal       Cardiovascular Exercise Tolerance: Good negative cardio ROS  Rhythm:regular Rate:Normal     Neuro/Psych  Headaches, negative neurological ROS  negative psych ROS   GI/Hepatic negative GI ROS, Neg liver ROS,   Endo/Other  negative endocrine ROSdiabetes, Type 2  Renal/GU negative Renal ROS     Musculoskeletal   Abdominal   Peds  Hematology negative hematology ROS (+)   Anesthesia Other Findings   Reproductive/Obstetrics negative OB ROS                           Anesthesia Physical Anesthesia Plan  ASA: II  Anesthesia Plan: General ETT   Post-op Pain Management:    Induction:   Airway Management Planned:   Additional Equipment:   Intra-op Plan:   Post-operative Plan:   Informed Consent: I have reviewed the patients History and Physical, chart, labs and discussed the procedure including the risks, benefits and alternatives for the proposed anesthesia with the patient or authorized representative who has indicated his/her understanding and acceptance.   Dental Advisory Given  Plan Discussed with: CRNA and Surgeon  Anesthesia Plan Comments:         Anesthesia Quick Evaluation

## 2013-04-14 NOTE — Transfer of Care (Signed)
Immediate Anesthesia Transfer of Care Note  Patient: Lacey Hicks  Procedure(s) Performed: Procedure(s): LAPAROSCOPIC ASSISTED VAGINAL HYSTERECTOMY (N/A)  Patient Location: PACU  Anesthesia Type:General  Level of Consciousness: awake, patient cooperative and responds to stimulation  Airway & Oxygen Therapy: Patient Spontanous Breathing and Patient connected to nasal cannula oxygen  Post-op Assessment: Report given to PACU RN and Post -op Vital signs reviewed and stable  Post vital signs: Reviewed and stable  Complications: No apparent anesthesia complications

## 2013-04-14 NOTE — Anesthesia Postprocedure Evaluation (Signed)
  Anesthesia Post-op Note  Patient: Lacey Hicks  Procedure(s) Performed: Procedure(s): LAPAROSCOPIC ASSISTED VAGINAL HYSTERECTOMY (N/A)  Patient Location: women's unit  Anesthesia Type:General  Level of Consciousness: awake, alert  and oriented  Airway and Oxygen Therapy: Patient Spontanous Breathing and Patient connected to nasal cannula oxygen  Post-op Pain: mild  Post-op Assessment: Post-op Vital signs reviewed and Patient's Cardiovascular Status Stable  Post-op Vital Signs: Reviewed and stable  Complications: No apparent anesthesia complications

## 2013-04-14 NOTE — Op Note (Signed)
NAMEMARCHE, Lacey Hicks           ACCOUNT NO.:  [id number]  MEDICAL RECORD NO.:  [id number]  LOCATION:  9305                          FACILITY:  [facility]  PHYSICIAN:  Carollyn Etcheverry L. Anushree Dorsi, M.D.DATE OF BIRTH:  12/02/1980  DATE OF PROCEDURE:  04/14/2013 DATE OF DISCHARGE:                              OPERATIVE REPORT   PREOPERATIVE DIAGNOSIS:  Menorrhagia and endometrial polyp.  POSTOPERATIVE DIAGNOSIS:  Menorrhagia and endometrial polyp.  PROCEDURE:  Laparoscopic-assisted vaginal hysterectomy.  SURGEON:  Jeani Hawking, MD.  ASSISTANT:  Freddy Finner, MD.  ANESTHESIA:  General.  ESTIMATED BLOOD LOSS:  250 mL.  URINE OUTPUT:  300 mL.  COMPLICATIONS:  None.  DRAINS:  Foley catheter.  DESCRIPTION OF PROCEDURE:  The patient was taken to the operating room. She was intubated.  She was prepped and draped in the usual sterile fashion.  She was placed in the lithotomy position.  The uterine manipulator was inserted, and a Foley catheter was inserted and draining clear urine.  Attention was turned to the abdomen where a small infraumbilical incision was made.  The Veress needle was inserted. Pneumoperitoneum was performed.  The Veress needle was removed and 11 mm trocar was inserted.  The patient was gently placed in Trendelenburg position.  The scope was introduced through the trocar sheath.  No intestinal injury or bleeding was noted.  The uterus was normal size. Adnexa were normal.  No adhesions were noted except a lot of thick bladder adhesions that she has had three C-section.  No endometriosis noted.  Adnexa were normal.  A secondary 5-mm super port was placed suprapubically, and it was placed under direct visualization.  We then used the EnSeal instrument.  Grasped the triple pedicle initially on the right than on the left and cauterized that and cut that and carried that down to the round ligament.  Hemostasis was excellent.  After we did that, we went down to the vagina  placed a weighted speculum in the vagina, made a circumferential incision around the cervix, entered the posterior cul-de-sac using Mayo scissors, overt, and used curved Heaney clamps to clamp the uterosacral cardinal ligaments on either side.  Each pedicle was clamped, cut, and suture ligated using #0 Vicryl suture. Because of the extensive bladder adhesions.  I went ahead and ordered indigo carmine to be administered while we worked on the bladder flap, and we did that sharply and carefully with Metzenbaum scissors.  We entered the anterior cul-de-sac.  We then placed curved Heaney clamps across remainder the broad ligament on either side.  Each pedicle was clamped, cut, and suture ligated using #0 Vicryl suture.  We removed the specimen identified as uterus and cervix.  It was sent to pathology. The urine output was clear and then was blue because of the indigo carmine.  There was no leakage of indigo carmine that we noted.  We then closed the posterior cuff in a running locked stitch using #0 Vicryl and then we closed the cuff completely front to back using #0 Vicryl suture. At this point, we went back up to the abdomen, replaced the pneumoperitoneum, irrigated the pelvis.  There was no area of oozing noted and no blue dye noted at  all.  The ureters were peristalsing normally.  I did place a piece of Surgicel across the vaginal cuff because it did appear a little raw especially were did adhesions, but there was actual bleeding.  This hopefully will prevent any adhesion formation in the future.  The scope was removed.  The sheath of the pneumoperitoneum was released.  The trocars were removed.  The 2-0 Vicryl was placed at the umbilical site.  Two skin sites were closed with Dermabond.  Local was infiltrated.  All sponge, lap, and instrument counts correct x2.  The patient went to the recovery room in stable condition, where she will be then sent to the third floor for  overnight observation.     Lacey Hicks, M.D.     Lacey Hicks  D:  04/14/2013  T:  04/14/2013  Job:  [id number]

## 2013-04-14 NOTE — Anesthesia Postprocedure Evaluation (Signed)
  Anesthesia Post-op Note  Patient: Lacey Hicks  Procedure(s) Performed: Procedure(s): LAPAROSCOPIC ASSISTED VAGINAL HYSTERECTOMY (N/A)  Patient Location: PACU  Anesthesia Type:General  Level of Consciousness: awake, alert  and oriented  Airway and Oxygen Therapy: Patient Spontanous Breathing and Patient connected to nasal cannula oxygen  Post-op Pain: mild  Post-op Assessment: Post-op Vital signs reviewed, Patient's Cardiovascular Status Stable, Respiratory Function Stable, Patent Airway, No signs of Nausea or vomiting and Pain level controlled  Post-op Vital Signs: Reviewed and stable  Complications: No apparent anesthesia complications

## 2013-04-15 ENCOUNTER — Encounter (HOSPITAL_COMMUNITY): Payer: Self-pay | Admitting: Obstetrics and Gynecology

## 2013-04-15 LAB — BASIC METABOLIC PANEL
BUN: 9 mg/dL (ref 6–23)
Chloride: 107 mEq/L (ref 96–112)
Creatinine, Ser: 0.82 mg/dL (ref 0.50–1.10)
GFR calc Af Amer: >90 mL/min (ref >90)

## 2013-04-15 LAB — CBC
HCT: 27.5 % — ABNORMAL LOW (ref 36.0–46.0)
MCHC: 34.5 g/dL (ref 30.0–36.0)
RDW: 12.1 % (ref 11.5–15.5)

## 2013-04-15 MED ORDER — POLYETHYLENE GLYCOL 3350 17 G PO PACK
17.0000 g | PACK | Freq: Every day | ORAL | Status: DC
  Administered 2013-04-15: 17 g via ORAL
  Filled 2013-04-15 (×2): qty 1

## 2013-04-15 MED ORDER — BISACODYL 10 MG RE SUPP
10.0000 mg | Freq: Once | RECTAL | Status: AC
  Administered 2013-04-15: 10 mg via RECTAL
  Filled 2013-04-15: qty 1

## 2013-04-15 MED ORDER — SIMETHICONE 80 MG PO CHEW
80.0000 mg | CHEWABLE_TABLET | Freq: Four times a day (QID) | ORAL | Status: DC | PRN
  Administered 2013-04-15 (×2): 80 mg via ORAL

## 2013-04-15 MED ORDER — HYDROMORPHONE HCL 2 MG PO TABS
2.0000 mg | ORAL_TABLET | ORAL | Status: DC | PRN
  Administered 2013-04-15 (×2): 2 mg via ORAL
  Filled 2013-04-15 (×2): qty 1

## 2013-04-15 MED ORDER — LORAZEPAM 1 MG PO TABS
1.0000 mg | ORAL_TABLET | ORAL | Status: DC | PRN
  Administered 2013-04-15 – 2013-04-16 (×4): 1 mg via ORAL
  Filled 2013-04-15 (×5): qty 1

## 2013-04-15 MED ORDER — OXYCODONE-ACETAMINOPHEN 5-325 MG PO TABS
1.0000 | ORAL_TABLET | ORAL | Status: DC | PRN
  Administered 2013-04-15: 1 via ORAL
  Administered 2013-04-15 – 2013-04-16 (×7): 2 via ORAL
  Filled 2013-04-15 (×5): qty 2
  Filled 2013-04-15: qty 1
  Filled 2013-04-15 (×2): qty 2

## 2013-04-15 MED ORDER — POLYETHYLENE GLYCOL 3350 17 G PO PACK
17.0000 g | PACK | Freq: Three times a day (TID) | ORAL | Status: DC
  Administered 2013-04-15: 17 g via ORAL
  Filled 2013-04-15 (×5): qty 1

## 2013-04-15 NOTE — Progress Notes (Signed)
Patient is having a lot of gas pain. Afebrile vss abd is slightly distended  Impression: Gas pain  Plan; Mylicon miralax Change to percocet Keep overnight

## 2013-04-15 NOTE — Progress Notes (Signed)
1 Day Post-Op Procedure(s) (LRB): LAPAROSCOPIC ASSISTED VAGINAL HYSTERECTOMY (N/A)  Subjective: Patient reports tolerating PO, + flatus and no problems voiding.  Complain of gas pain  Objective: I have reviewed patient's vital signs, intake and output and medications.  General: alert, cooperative and appears stated age Vaginal Bleeding: none abdomen is slightly distended   Assessment: s/p Procedure(s): LAPAROSCOPIC ASSISTED VAGINAL HYSTERECTOMY (N/A): stable, progressing well and tolerating diet  Plan: Advance diet Encourage ambulation Advance to PO medication Discontinue IV fluids Dulcolax suppository Possible discharge this afternoon  LOS: 1 day    Linley Moskal L 04/15/2013, 8:27 AM

## 2013-04-16 MED ORDER — LORAZEPAM 1 MG PO TABS: 1.0000 mg | ORAL_TABLET | ORAL | Status: DC | PRN

## 2013-04-16 MED ORDER — OXYCODONE-ACETAMINOPHEN 10-325 MG PO TABS: 1.0000 | ORAL_TABLET | ORAL | Status: DC | PRN

## 2013-04-16 NOTE — Discharge Summary (Signed)
Admission Diagnosis: Menorrhagia  Discharge Diagnosis: Same  Hospital Course: 32 year old female admitted for LAVH. She did well post op but had bladder spasms on POD #1 and I kept her an extra day. By POD #2 she was ambulating, voiding without discomfort and had stable vital signs. She was discharged home in good condition. Discharge meds - Percocet and Ativan for the bladder spasms No driving for 1 week Follow up in 1 week.

## 2013-04-16 NOTE — Progress Notes (Signed)
2 Days Post-Op Procedure(s) (LRB): LAPAROSCOPIC ASSISTED VAGINAL HYSTERECTOMY (N/A)  Subjective: Patient reports tolerating PO, + flatus and no problems voiding.  Feels much better.  Objective: I have reviewed patient's vital signs, intake and output, medications and labs.  General: alert, cooperative and appears stated age Vaginal Bleeding: none Abdomen is soft nontender with good bowel sounds  Assessment: s/p Procedure(s): LAPAROSCOPIC ASSISTED VAGINAL HYSTERECTOMY (N/A): stable, progressing well and tolerating diet  Plan: Advance diet Discharge home  LOS: 2 days    Bevin Mayall L 04/16/2013, 8:58 AM

## 2013-04-16 NOTE — Progress Notes (Signed)
Discharge instructions reviewed with patient.  Patient states understanding of home care, medications, activity, signs/symptoms to report to MD and return MD office visit  No home equipment needed..  Patient ambulated in stable condition with staff for discharge without incident.

## 2013-04-17 ENCOUNTER — Encounter (HOSPITAL_COMMUNITY): Payer: Self-pay | Admitting: *Deleted

## 2013-04-17 ENCOUNTER — Observation Stay (HOSPITAL_COMMUNITY)
Admission: AD | Admit: 2013-04-17 | Discharge: 2013-04-19 | DRG: 864 | Disposition: A | Discharge status: Home / Self Care | Payer: 59 | Source: Ambulatory Visit | Attending: Obstetrics and Gynecology | Admitting: Obstetrics and Gynecology

## 2013-04-17 DIAGNOSIS — K644 Residual hemorrhoidal skin tags: Secondary | ICD-10-CM

## 2013-04-17 DIAGNOSIS — K602 Anal fissure, unspecified: Secondary | ICD-10-CM

## 2013-04-17 DIAGNOSIS — G8918 Other acute postprocedural pain: Secondary | ICD-10-CM | POA: Diagnosis present

## 2013-04-17 DIAGNOSIS — K648 Other hemorrhoids: Secondary | ICD-10-CM

## 2013-04-17 DIAGNOSIS — R109 Unspecified abdominal pain: Secondary | ICD-10-CM | POA: Diagnosis present

## 2013-04-17 DIAGNOSIS — R5082 Postprocedural fever: Principal | ICD-10-CM | POA: Diagnosis present

## 2013-04-17 DIAGNOSIS — N949 Unspecified condition associated with female genital organs and menstrual cycle: Secondary | ICD-10-CM

## 2013-04-17 DIAGNOSIS — K5900 Constipation, unspecified: Secondary | ICD-10-CM | POA: Diagnosis present

## 2013-04-17 DIAGNOSIS — R509 Fever, unspecified: Secondary | ICD-10-CM

## 2013-04-17 LAB — CBC WITH DIFFERENTIAL/PLATELET
Basophils Absolute: 0 10*3/uL (ref 0.0–0.1)
Basophils Relative: 0 % (ref 0–1)
Eosinophils Absolute: 0.3 10*3/uL (ref 0.0–0.7)
Eosinophils Relative: 3 % (ref 0–5)
MCH: 31.5 pg (ref 26.0–34.0)
MCHC: 34.3 g/dL (ref 30.0–36.0)
MCV: 92 fL (ref 78.0–100.0)
Platelets: 194 10*3/uL (ref 150–400)
RDW: 11.7 % (ref 11.5–15.5)
WBC: 9.4 10*3/uL (ref 4.0–10.5)

## 2013-04-17 LAB — URINALYSIS, ROUTINE W REFLEX MICROSCOPIC
Bilirubin Urine: NEGATIVE
Ketones, ur: NEGATIVE mg/dL
Protein, ur: NEGATIVE mg/dL
Urobilinogen, UA: 2 mg/dL — ABNORMAL HIGH (ref 0.0–1.0)

## 2013-04-17 LAB — URINE MICROSCOPIC-ADD ON

## 2013-04-17 MED ORDER — LACTATED RINGERS IV SOLN
INTRAVENOUS | Status: DC
  Administered 2013-04-17 – 2013-04-18 (×2): via INTRAVENOUS

## 2013-04-17 MED ORDER — ZOLPIDEM TARTRATE 5 MG PO TABS
5.0000 mg | ORAL_TABLET | Freq: Every evening | ORAL | Status: DC | PRN
  Administered 2013-04-18: 5 mg via ORAL
  Filled 2013-04-17: qty 1

## 2013-04-17 MED ORDER — SIMETHICONE 80 MG PO CHEW
80.0000 mg | CHEWABLE_TABLET | Freq: Four times a day (QID) | ORAL | Status: DC | PRN
  Filled 2013-04-17: qty 1

## 2013-04-17 MED ORDER — HYDROMORPHONE HCL PF 1 MG/ML IJ SOLN
1.0000 mg | Freq: Once | INTRAMUSCULAR | Status: AC
  Administered 2013-04-17: 1 mg via INTRAMUSCULAR
  Filled 2013-04-17: qty 1

## 2013-04-17 MED ORDER — HYDROMORPHONE HCL 2 MG PO TABS
2.0000 mg | ORAL_TABLET | ORAL | Status: DC | PRN
  Administered 2013-04-17 – 2013-04-19 (×9): 2 mg via ORAL
  Filled 2013-04-17 (×9): qty 1

## 2013-04-17 MED ORDER — HYDROMORPHONE HCL 2 MG PO TABS
2.0000 mg | ORAL_TABLET | Freq: Four times a day (QID) | ORAL | Status: DC | PRN
  Administered 2013-04-17: 2 mg via ORAL
  Filled 2013-04-17: qty 1

## 2013-04-17 MED ORDER — AMPICILLIN-SULBACTAM SODIUM 3 (2-1) G IJ SOLR
3.0000 g | Freq: Four times a day (QID) | INTRAMUSCULAR | Status: DC
  Administered 2013-04-17 – 2013-04-19 (×7): 3 g via INTRAVENOUS
  Filled 2013-04-17 (×10): qty 3

## 2013-04-17 MED ORDER — MAGNESIUM CITRATE PO SOLN
1.0000 | Freq: Once | ORAL | Status: AC | PRN
  Filled 2013-04-17: qty 296

## 2013-04-17 MED ORDER — ACETAMINOPHEN 325 MG PO TABS
650.0000 mg | ORAL_TABLET | Freq: Four times a day (QID) | ORAL | Status: DC | PRN
  Administered 2013-04-18: 650 mg via ORAL
  Filled 2013-04-17: qty 2

## 2013-04-17 MED ORDER — POLYETHYLENE GLYCOL 3350 17 G PO PACK
17.0000 g | PACK | Freq: Every day | ORAL | Status: DC | PRN
  Filled 2013-04-17: qty 1

## 2013-04-17 NOTE — MAU Note (Signed)
Fever started today around 4:00 p.m. Highest 102.4 had LAVH with incision on Thursday.

## 2013-04-17 NOTE — MAU Provider Note (Signed)
History     CSN: [id number]  Arrival date and time: 04/17/13 2019   First Provider Initiated Contact with Patient 04/17/13 2058      Chief Complaint  Patient presents with  . Fever   HPI This is a 32 y.o. female who presents with c/o fever postop.  She is POD#3 from a LAVH.  Had some problems with bladder spasms.  Went home and had increased abdominal pain yesterday and fever developed. Has had very little gas passing and has not had a BM, despite using Miralax.  RN Note: PT SAYS SHE HAD A HYST - NOT REMOVE OVARIES, VAG AND SMALL INCISION IN C/S SCAR. WENT HOME AT 5 PM YESTERDAY. THEN TODAY HAD FEVER ART 4 PM. IN HOSPITAL- 99. DR GREWAL DID SURGERY. TOOK TEMP AT HOME 100.3- TOOK PERCOCET AND TYLENOL- THEN TEMP WAS 102.8.  OB History   Grav Para Term Preterm Abortions TAB SAB Ect Mult Living   4 3   1  1   3       Past Medical History  Diagnosis Date  . Hemorrhoids   . Constipation   . Migraines   . Urinary tract infection   . Arthritis   . Diabetes mellitus     "borderline"    Past Surgical History  Procedure Laterality Date  . Tonsillectomy    . Cesarean section  04/10/99,10/01/03,07/18/10  . Wisdom tooth extraction  1997  . Hemorroidectomy    . Anal fisure    . Sphinterotomy    . Tubal ligation    . Laparoscopic assisted vaginal hysterectomy N/A 04/14/2013    Procedure: LAPAROSCOPIC ASSISTED VAGINAL HYSTERECTOMY;  Surgeon: Jeani Hawking, MD;  Location: [facility];  Service: Gynecology;  Laterality: N/A;  . Abdominal hysterectomy      Family History  Problem Relation Age of Onset  . Heart disease    . Arthritis    . Diabetes    . Cervical cancer    . Breast cancer      History  Substance Use Topics  . Smoking status: Former Smoker -- 2.00 packs/day for 10 years    Types: Cigarettes  . Smokeless tobacco: Former Neurosurgeon    Quit date: 08/01/2012  . Alcohol Use: Yes     Comment: social    Allergies:  Allergies  Allergen Reactions  . Other Anaphylaxis   Tree nuts  . Wellbutrin (Bupropion Hcl) Hives and Swelling  . Cephalosporins Itching and Rash    Childhood reaction. Has taken cephalosporins as an adult and tolerated them.    Prescriptions prior to admission  Medication Sig Dispense Refill  . albuterol (PROVENTIL HFA;VENTOLIN HFA) 108 (90 BASE) MCG/ACT inhaler Inhale 2 puffs into the lungs every 6 (six) hours as needed for wheezing.      . diazepam (VALIUM) 10 MG tablet Take 10 mg by mouth every 6 (six) hours as needed (muscle spasms in neck).       . docusate sodium (COLACE) 100 MG capsule Take 600 mg by mouth daily as needed for constipation.      Marland Kitchen LORazepam (ATIVAN) 1 MG tablet Take 1 tablet (1 mg total) by mouth every 4 (four) hours as needed for anxiety.  30 tablet  0  . lubiprostone (AMITIZA) 24 MCG capsule Take 24 mcg by mouth daily.      Marland Kitchen oxyCODONE-acetaminophen (PERCOCET) 10-325 MG per tablet Take 1 tablet by mouth every 4 (four) hours as needed for pain.  60 tablet  0  . OVER  THE COUNTER MEDICATION Take 3 capsules by mouth 3 (three) times daily before meals. 7-KETO MUSCLEAN: [employer]        Review of Systems  Constitutional: Positive for fever, chills and malaise/fatigue.  Respiratory: Negative for cough and shortness of breath.   Cardiovascular: Negative for chest pain.  Gastrointestinal: Positive for abdominal pain and constipation. Negative for heartburn, nausea, vomiting and diarrhea.  Genitourinary: Negative for dysuria.  Musculoskeletal: Positive for myalgias.  Neurological: Negative for dizziness and headaches.   Physical Exam   Blood pressure 110/61, pulse 127, temperature 101.5 F (38.6 C), temperature source Oral, resp. rate 18, last menstrual period 03/26/2013.  Physical Exam  Constitutional: She is oriented to person, place, and time. She appears well-developed and well-nourished. No distress.  Cardiovascular: Normal rate, regular rhythm and normal heart sounds.  Exam reveals no gallop and no  friction rub.   No murmur heard. Respiratory: Effort normal and breath sounds normal. No respiratory distress. She has no wheezes. She has no rales. She exhibits no tenderness.  GI: Soft. She exhibits distension (slightly distended). She exhibits no mass. There is tenderness. There is no rebound and no guarding.  Healing incisions at umbilicus and suprapubic  Musculoskeletal: Normal range of motion.  Neurological: She is alert and oriented to person, place, and time.  Skin: Skin is warm and dry.  Psychiatric: She has a normal mood and affect.    MAU Course  Procedures  MDM Results for orders placed during the hospital encounter of 04/17/13 (from the past 24 hour(s))  URINALYSIS, ROUTINE W REFLEX MICROSCOPIC     Status: Abnormal   Collection Time    04/17/13  8:30 PM      Result Value Range   Color, Urine YELLOW  YELLOW   APPearance CLEAR  CLEAR   Specific Gravity, Urine 1.020  1.005 - 1.030   pH 6.0  5.0 - 8.0   Glucose, UA NEGATIVE  NEGATIVE mg/dL   Hgb urine dipstick SMALL (*) NEGATIVE   Bilirubin Urine NEGATIVE  NEGATIVE   Ketones, ur NEGATIVE  NEGATIVE mg/dL   Protein, ur NEGATIVE  NEGATIVE mg/dL   Urobilinogen, UA 2.0 (*) 0.0 - 1.0 mg/dL   Nitrite NEGATIVE  NEGATIVE   Leukocytes, UA NEGATIVE  NEGATIVE  URINE MICROSCOPIC-ADD ON     Status: None   Collection Time    04/17/13  8:30 PM      Result Value Range   Squamous Epithelial / LPF RARE  RARE   WBC, UA 0-2  <3 WBC/hpf   RBC / HPF 0-2  <3 RBC/hpf  CBC WITH DIFFERENTIAL     Status: Abnormal   Collection Time    04/17/13  9:40 PM      Result Value Range   WBC 9.4  4.0 - 10.5 K/uL   RBC 3.52 (*) 3.87 - 5.11 MIL/uL   Hemoglobin 11.1 (*) 12.0 - 15.0 g/dL   HCT 16.1 (*) 09.6 - 04.5 %   MCV 92.0  78.0 - 100.0 fL   MCH 31.5  26.0 - 34.0 pg   MCHC 34.3  30.0 - 36.0 g/dL   RDW 40.9  81.1 - 91.4 %   Platelets 194  150 - 400 K/uL   Neutrophils Relative % 68  43 - 77 %   Neutro Abs 6.4  1.7 - 7.7 K/uL   Lymphocytes  Relative 21  12 - 46 %   Lymphs Abs 2.0  0.7 - 4.0 K/uL   Monocytes Relative 7  3 - 12 %   Monocytes Absolute 0.7  0.1 - 1.0 K/uL   Eosinophils Relative 3  0 - 5 %   Eosinophils Absolute 0.3  0.0 - 0.7 K/uL   Basophils Relative 0  0 - 1 %   Basophils Absolute 0.0  0.0 - 0.1 K/uL     Assessment and Plan  A:  PostOp Day #3      Fever unknown origin      Diminished bowel function  P:  Admit per Dr Arelia Sneddon      IV LR      Unasyn        Wynelle Bourgeois 04/17/2013, 9:09 PM

## 2013-04-17 NOTE — MAU Note (Addendum)
PT SAYS SHE HAD A  HYST - NOT REMOVE OVARIES,  VAG AND SMALL INCISION IN  C/S SCAR.      WENT   HOME AT 5 PM YESTERDAY. THEN TODAY HAD FEVER ART 4 PM.     IN HOSPITAL- 99. DR GREWAL DID SURGERY.  TOOK  TEMP AT HOME  100.3- TOOK PERCOCET AND TYLENOL- THEN  TEMP WAS 102.8.

## 2013-04-18 ENCOUNTER — Inpatient Hospital Stay (HOSPITAL_COMMUNITY): Payer: 59

## 2013-04-18 MED ORDER — IBUPROFEN 600 MG PO TABS
600.0000 mg | ORAL_TABLET | Freq: Four times a day (QID) | ORAL | Status: DC | PRN
  Administered 2013-04-18: 600 mg via ORAL
  Filled 2013-04-18: qty 1

## 2013-04-18 MED ORDER — ZINC OXIDE 20 % EX OINT
TOPICAL_OINTMENT | CUTANEOUS | Status: DC | PRN
  Administered 2013-04-18: 21:00:00 via TOPICAL
  Filled 2013-04-18: qty 56.7

## 2013-04-18 MED ORDER — MAGNESIUM CITRATE PO SOLN
1.0000 | Freq: Once | ORAL | Status: AC | PRN
  Administered 2013-04-18: 1 via ORAL
  Filled 2013-04-18: qty 296

## 2013-04-18 MED ORDER — BISACODYL 10 MG RE SUPP
10.0000 mg | Freq: Every day | RECTAL | Status: DC | PRN
  Administered 2013-04-18: 10 mg via RECTAL
  Filled 2013-04-18: qty 1

## 2013-04-18 MED ORDER — LORAZEPAM 1 MG PO TABS
1.0000 mg | ORAL_TABLET | ORAL | Status: DC | PRN
  Administered 2013-04-18 – 2013-04-19 (×6): 1 mg via ORAL
  Filled 2013-04-18 (×6): qty 1

## 2013-04-18 NOTE — Plan of Care (Signed)
Problem: Phase I Progression Outcomes Goal: OOB as tolerated unless otherwise ordered Outcome: Completed/Met Date Met:  04/18/13 Tolerating being up in the room well Goal: Initial discharge plan identified Outcome: Completed/Met Date Met:  04/18/13 Afebrile Pain controlled Understands when to call MD Understands self care    Goal: Voiding-avoid urinary catheter unless indicated Outcome: Completed/Met Date Met:  04/18/13 Voids Qs without difficulty Goal: Hemodynamically stable Outcome: Completed/Met Date Met:  04/18/13 Afebrile at this time

## 2013-04-18 NOTE — Plan of Care (Signed)
Problem: Phase I Progression Outcomes Goal: Pain controlled with appropriate interventions Outcome: Completed/Met Date Met:  04/18/13 Pain controlled on po Dilaudid

## 2013-04-18 NOTE — Progress Notes (Signed)
Pt c/o lots of pelvic pain while sitting on the toilet.  She states "it feels like my insides are falling out".  Phoned Dr. Rana Snare with this information.  See order for pelvic ultrasound.

## 2013-04-18 NOTE — Progress Notes (Signed)
Subjective: Patient reports tolerating PO, + BM and no problems voiding.  Pain better today but worse than 2 days ago.  Tolerated [employer] this am.  Objective: I have reviewed patient's vital signs and labs.  T max 100.5   General: alert, cooperative, appears stated age and mild distress GI: soft, non-tender; bowel sounds normal; no masses,  no organomegaly and incision: clean, dry and intact Vaginal Bleeding: none Lungs:  CTAB   Assessment/Plan: S/P LAVH 4 days ago Post op fever without obvious source.  Normal WBC.  Continue with IV Unasyn.  IS for ? Atelectasis Repeat CBC in am With cont with miralax/ducolax today for constipation   LOS: 1 day    Margret Moat C 04/18/2013, 10:36 AM

## 2013-04-18 NOTE — Progress Notes (Signed)
Pt asking for Ativan or Valium, stating she takes Valium everyday at home and that Ativan was prescribed during her post-op hospitalization to help with bowel/bladder spasms.  Phoned Dr. Rana Snare with pt's request.  See order for Ativan.

## 2013-04-18 NOTE — H&P (Signed)
  32 yo status post lavh on 5/21.  Admitted with fever.  PMH. FH AND SH SEE NP'S H AND P  PE TEMP 101 OTHE VSS        ABD SOFT DEMINISHED BOWEL SOUNDS  IMP: POST OP FEVER  PlAN:  UNASYN. SONO IF DOES NOT RESPOND

## 2013-04-18 NOTE — Progress Notes (Signed)
Patient c/o rectal irritation from stool, Zinc oxide ordered

## 2013-04-19 LAB — CBC
MCH: 31.6 pg (ref 26.0–34.0)
MCV: 92.3 fL (ref 78.0–100.0)
Platelets: 227 10*3/uL (ref 150–400)
RBC: 3.51 MIL/uL — ABNORMAL LOW (ref 3.87–5.11)
RDW: 11.7 % (ref 11.5–15.5)

## 2013-04-19 MED ORDER — AMOXICILLIN-POT CLAVULANATE 875-125 MG PO TABS: 1.0000 | ORAL_TABLET | Freq: Two times a day (BID) | ORAL | Status: DC

## 2013-04-19 MED ORDER — HYDROMORPHONE HCL 2 MG PO TABS: 2.0000 mg | ORAL_TABLET | ORAL | Status: DC | PRN

## 2013-04-19 NOTE — Progress Notes (Signed)
Patient is feeling good this am. No nausea. Normal amount of post op pain. No vaginal discharge No fever in over 24 hours vss General alert and oriented Lung ctab Car rrr Abdomen is soft and flat and completely non tender  WBC has been normal since admission  IMPRESSION: Post op fever ?etiology constipation (now resolved) or atelectasis or early cuff cellulitis Patient is clinically improved  PLAN: Continue Unasyn Probable discharge at lunchtime

## 2013-04-19 NOTE — Progress Notes (Signed)
Pt discharged to home with husband.  Condition stable.  Pt to car via wheelchair with K.Kilfoyle, NT.  No equipment for home ordered at discharge.

## 2013-04-19 NOTE — Discharge Summary (Signed)
  Admission Diagnosis: Fever Post op LAVH  Discharge Diagnosis: Same  Hospital Course: 32 year old status post LAVH that went home on Saturday post op. Post op course only immediately complicated by bladder spasms. She went home and returned on Sunday with a fever of unknown origin. She had a normal WBC and was placed on Unasyn and Dilaudid. She had a pelvic ultrasound yesterday that was normal.  Today, she has been afebrile for over 24 hours and has stable vital signs and normal abdominal exam. She will be discharged home in good condition. Discharged with augmentin 875 mg po bid for 7 days and dilaudid Follow up with on Thursday.

## 2013-04-19 NOTE — Progress Notes (Addendum)
Patient has had 7 loose stools since Mag Citrate was completed on 04/18/13

## 2013-05-09 ENCOUNTER — Encounter (INDEPENDENT_AMBULATORY_CARE_PROVIDER_SITE_OTHER): Payer: Self-pay | Admitting: *Deleted

## 2013-05-12 ENCOUNTER — Encounter: Payer: Self-pay | Admitting: Internal Medicine

## 2013-06-02 ENCOUNTER — Ambulatory Visit (INDEPENDENT_AMBULATORY_CARE_PROVIDER_SITE_OTHER): Payer: 59 | Admitting: Internal Medicine

## 2013-06-03 ENCOUNTER — Encounter: Payer: Self-pay | Admitting: Internal Medicine

## 2013-06-03 ENCOUNTER — Ambulatory Visit (INDEPENDENT_AMBULATORY_CARE_PROVIDER_SITE_OTHER): Payer: 59 | Admitting: Internal Medicine

## 2013-06-03 VITALS — BP 100/70 | HR 68 | Ht 62.0 in | Wt 143.2 lb

## 2013-06-03 DIAGNOSIS — R143 Flatulence: Secondary | ICD-10-CM

## 2013-06-03 DIAGNOSIS — R932 Abnormal findings on diagnostic imaging of liver and biliary tract: Secondary | ICD-10-CM

## 2013-06-03 DIAGNOSIS — R141 Gas pain: Secondary | ICD-10-CM

## 2013-06-03 DIAGNOSIS — R1013 Epigastric pain: Secondary | ICD-10-CM

## 2013-06-03 MED ORDER — HYOSCYAMINE SULFATE 0.125 MG SL SUBL: 0.1250 mg | SUBLINGUAL_TABLET | SUBLINGUAL | Status: DC | PRN

## 2013-06-03 NOTE — Progress Notes (Signed)
Subjective:  Referred by: Lenise Herald, PA-C   Patient ID: Lacey Hicks, female    DOB: [birth year], 32 y.o.   MRN: [id number]  HPI This is a very nice woman here with complaints of intermittent epigastric pain since March. It is a stabbing pain that radiates into the back in between the scapula it. Never had this pain until March. She cannot tell what triggers it. It will last for up to 45 minutes and the intense. Antacids had not helped. She says that if she massage was her abdomen it seems to improved. She has regular bowel movements, she does take Amitiza to help because she is on chronic narcotics for cervical pain. She says she has heartburn gas and flatus but has been that way for a long time. There is no relief of epigastric pain with passage of flatus there is no association. She has been to the emergency room she's had evaluations that include an ultrasound which suggested an 8 mm focal dilation of the bile duct and possible stone though the radiologist's interpretation was that was probably volume averaging related to the hepatic artery in the same region. CT scan this next day on May 9 did not demonstrate any significant pathology. She does have some thickening of the renal cortex and is due for some follow up on that. Weight is stable she has no other issues to report at this time. At the beginning of the year she had a hemorrhoidectomy and an anal sphincterotomy for fissure. She's had a hysterectomy this year as well. Allergies  Allergen Reactions  . Other Anaphylaxis    Tree nuts  . Wellbutrin (Bupropion Hcl) Hives and Swelling  . Cephalosporins Itching and Rash    Childhood reaction. Has taken cephalosporins as an adult and tolerated them.   Outpatient Prescriptions Prior to Visit  Medication Sig Dispense Refill  . albuterol (PROVENTIL HFA;VENTOLIN HFA) 108 (90 BASE) MCG/ACT inhaler Inhale 2 puffs into the lungs every 6 (six) hours as needed for wheezing.      . diazepam  (VALIUM) 10 MG tablet Take 10 mg by mouth every 6 (six) hours as needed (muscle spasms in neck).       . docusate sodium (COLACE) 100 MG capsule Take 600 mg by mouth daily as needed for constipation.      Marland Kitchen LORazepam (ATIVAN) 1 MG tablet Take 1 tablet (1 mg total) by mouth every 4 (four) hours as needed for anxiety.  30 tablet  0  . lubiprostone (AMITIZA) 24 MCG capsule Take 24 mcg by mouth daily.      Marland Kitchen oxyCODONE-acetaminophen (PERCOCET) 10-325 MG per tablet Take 1 tablet by mouth every 4 (four) hours as needed for pain.  60 tablet  0  . amoxicillin-clavulanate (AUGMENTIN) 875-125 MG per tablet Take 1 tablet by mouth 2 (two) times daily.  14 tablet  0  . HYDROmorphone (DILAUDID) 2 MG tablet Take 1 tablet (2 mg total) by mouth every 4 (four) hours as needed.  30 tablet  0  . OVER THE COUNTER MEDICATION Take 3 capsules by mouth 3 (three) times daily before meals. 7-KETO MUSCLEAN: [employer]       No facility-administered medications prior to visit.   Past Medical History  Diagnosis Date  . Hemorrhoids   . Constipation   . Migraines   . Urinary tract infection   . Arthritis   . Prediabetes   . Anal fissure   . Chronic neck pain    anxiety Past Surgical  History  Procedure Laterality Date  . Tonsillectomy    . Cesarean section  04/10/99,10/01/03,07/18/10  . Wisdom tooth extraction  1997  . Hemorroidectomy    . Anal fisure    . Sphinterotomy    . Tubal ligation    . Laparoscopic assisted vaginal hysterectomy N/A 04/14/2013    Procedure: LAPAROSCOPIC ASSISTED VAGINAL HYSTERECTOMY;  Surgeon: Jeani Hawking, MD;  Location: [facility];  Service: Gynecology;  Laterality: N/A;   History   Social History  . Marital Status: Married    Spouse Name: N/A    Number of Children: 3  . Years of Education: N/A   Occupational History  . claims [employer]   Social History Main Topics  . Smoking status: Former Smoker -- 2.00 packs/day for 10 years    Types: Cigarettes  .  Smokeless tobacco: Former Neurosurgeon    Quit date: 08/01/2012  . Alcohol Use: No     Comment: social  . Drug Use: No          Social History Narrative   Married, works as a Advertising account planner. One son two daughters.   Family History  Problem Relation Age of Onset  . Ovarian cancer Mother   . Diabetes Paternal Grandmother   . Heart disease Paternal Grandfather    Review of Systems Chronic neck pain is controlled with oxycodone. She also has problems with fatigue and headaches. All other review of systems negative or as per PI.    Objective:   Physical Exam General:  Well-developed, well-nourished and in no acute distress Eyes:  anicteric. ENT:   Mouth and posterior pharynx free of lesions.  Neck:   supple w/o thyromegaly or mass.  Lungs: Clear to auscultation bilaterally. Heart:  S1S2, no rubs, murmurs, gallops. Abdomen:  soft, non-tender, no hepatosplenomegaly, hernia, or mass and BS+.  Lymph:  no cervical or supraclavicular adenopathy. Extremities:   no edema Skin   no rash. Neuro:  A&O x 3.  Psych:  appropriate mood and  Affect.   Data Reviewed: Ultrasound CT scan labs in the EMR. She also had a normal basic metabolic panel Apr 08 2013.    Assessment & Plan:   1. Abdominal pain, epigastric   2. Abnormal ultrasound, bile duct   3. Flatulence     1. Sounds like biliary pain very possible - though some LUQ dull pain also 2. Evaluate with MRCP since ? Of stone and focal bile duct dilation on [place] 5/8. Depending upon what is seen an ERCP could be needed. I did have a primary conversation about the nature and indications for that procedure and possible risks including pancreatitis and significant illness though rare they are possible. 3. May need CCK-HIDA to check GB EF 4. She c/o excessive flatulence - ? IBS - will try probiotic/FODMAPS, Align Coupon 5. Prn hyoscyamine  I appreciate the opportunity to care for this patient. CC: MANN, BENJAMIN, PA-C

## 2013-06-03 NOTE — Patient Instructions (Addendum)
Today you have been given a FODMAP to read over and follow.  We have sent the following medications to your pharmacy for you to pick up at your convenience: Generic Levsin  Also information on gas prevention.  We have given you samples of Align. This puts good bacteria back into your colon. You should take 1 capsule by mouth once daily. If this works well for you, it can be purchased over the counter. Coupon for $3.00 given today.  You have been set up for a MRCP at [facility] on 06/07/13 at 3:00pm, arrive at 2:45pm.  Nothing to eat or drink 4 hours prior to your test.  I appreciate the opportunity to care for you.

## 2013-06-07 ENCOUNTER — Ambulatory Visit (HOSPITAL_COMMUNITY)
Admission: RE | Admit: 2013-06-07 | Discharge: 2013-06-07 | Disposition: A | Discharge status: Home / Self Care | Payer: 59 | Source: Ambulatory Visit | Attending: Internal Medicine | Admitting: Internal Medicine

## 2013-06-07 ENCOUNTER — Other Ambulatory Visit: Payer: Self-pay | Admitting: Internal Medicine

## 2013-06-07 DIAGNOSIS — R932 Abnormal findings on diagnostic imaging of liver and biliary tract: Secondary | ICD-10-CM

## 2013-06-07 DIAGNOSIS — K7689 Other specified diseases of liver: Secondary | ICD-10-CM | POA: Insufficient documentation

## 2013-06-07 DIAGNOSIS — R1013 Epigastric pain: Secondary | ICD-10-CM

## 2013-06-07 LAB — POCT I-STAT, CHEM 8
Chloride: 102 mEq/L (ref 96–112)
Creatinine, Ser: 0.8 mg/dL (ref 0.50–1.10)
Glucose, Bld: 83 mg/dL (ref 70–99)
HCT: 42 % (ref 36.0–46.0)
Potassium: 4.2 mEq/L (ref 3.5–5.1)
Sodium: 138 mEq/L (ref 135–145)

## 2013-06-07 MED ORDER — GADOBENATE DIMEGLUMINE 529 MG/ML IV SOLN
13.0000 mL | Freq: Once | INTRAVENOUS | Status: AC | PRN
  Administered 2013-06-07: 13 mL via INTRAVENOUS

## 2013-06-13 ENCOUNTER — Other Ambulatory Visit: Payer: Self-pay

## 2013-06-13 ENCOUNTER — Encounter: Payer: Self-pay | Admitting: Internal Medicine

## 2013-06-13 DIAGNOSIS — R1013 Epigastric pain: Secondary | ICD-10-CM

## 2013-06-13 NOTE — Progress Notes (Signed)
Quick Note:  MRCP is ok  1) How is she ? Is hyoscyamine helping 2) If still symptomatic I recommend HIDA scan w/ ejection fraction re: epigastric pain 3) see her My Chart message for when and where to call ______

## 2013-06-20 ENCOUNTER — Encounter (HOSPITAL_COMMUNITY): Payer: Self-pay

## 2013-06-20 ENCOUNTER — Encounter (HOSPITAL_COMMUNITY)
Admission: RE | Admit: 2013-06-20 | Discharge: 2013-06-20 | Disposition: A | Discharge status: Home / Self Care | Payer: 59 | Source: Ambulatory Visit | Attending: Internal Medicine | Admitting: Internal Medicine

## 2013-06-20 DIAGNOSIS — R1013 Epigastric pain: Secondary | ICD-10-CM

## 2013-06-20 DIAGNOSIS — R109 Unspecified abdominal pain: Secondary | ICD-10-CM | POA: Insufficient documentation

## 2013-06-20 MED ORDER — SINCALIDE 5 MCG IJ SOLR: 0.0200 ug/kg | Freq: Once | INTRAMUSCULAR | Status: DC

## 2013-06-20 MED ORDER — TECHNETIUM TC 99M MEBROFENIN IV KIT
5.1000 | PACK | Freq: Once | INTRAVENOUS | Status: AC | PRN
  Administered 2013-06-20: 5.1 via INTRAVENOUS

## 2013-06-21 NOTE — Progress Notes (Signed)
Quick Note:  Gallbladder is functinoing normally Ask her to schedule a follow-up visit to review her situation - non-urgent ______

## 2013-07-22 ENCOUNTER — Ambulatory Visit (INDEPENDENT_AMBULATORY_CARE_PROVIDER_SITE_OTHER): Payer: 59 | Admitting: Internal Medicine

## 2013-07-22 ENCOUNTER — Encounter: Payer: Self-pay | Admitting: Internal Medicine

## 2013-07-22 VITALS — BP 90/60 | HR 80 | Ht 62.0 in | Wt 137.1 lb

## 2013-07-22 DIAGNOSIS — R12 Heartburn: Secondary | ICD-10-CM

## 2013-07-22 DIAGNOSIS — R1013 Epigastric pain: Secondary | ICD-10-CM

## 2013-07-22 MED ORDER — PANTOPRAZOLE SODIUM 40 MG PO TBEC: 40.0000 mg | DELAYED_RELEASE_TABLET | Freq: Every day | ORAL | Status: DC

## 2013-07-22 NOTE — Progress Notes (Signed)
  Subjective:    Patient ID: Lacey Hicks, female    DOB: [birth year], 32 y.o.   MRN: [id number]  HPI The patient is here for follow-up of epigastric pain. She has episodic epigastric pain that sounded like biliary colic to me. She had an [place] that raised ? Of dilated bile duct - focal, but MRCP was normal. She has a high GB EF and had same pain when CCK infusion given.   She also has frequent morning heartburn but that is different from severe epigastric pain that occurs 1-2x/week and may awaken her. Hyoscyamine made pain worse.  Medications, allergies, past medical history, past surgical history, family history and social history are reviewed and updated in the EMR.  Review of Systems As above     Objective:   Physical Exam WDWN NAD  MRCP 06/03/13 1. Negative MRCP. No evidence of biliary dilatation or  choledocholithiasis.  2. Stable cyst in the right hepatic lobe.  3. No acute findings.   HIDA w/ EF 06/13/13 Findings: Following the intravenous administration of the  radiopharmaceutical there is uniform tracer uptake by the liver  with clearance from blood pool. Radiotracer accumulation within  the gallbladder is noted by 15 minutes.  The gallbladder ejection fraction was calculated to be 78%.  The patient did experience symptoms during CCK infusion.  IMPRESSION:  1. Patent cystic duct without evidence for acute cholecystitis.  2. Normal gallbladder ejection fraction.      Assessment & Plan:  Abdominal pain, epigastric    This sounds like it could be biliary pain though objective testing does not support. I know there is some evidence, not strong, that patients with high GB EF will improve w/ cholecystectomy. I have referred her to Dr. Derrell Lolling to discuss possible surgical Tx of her pain (i.e. Cholecystectomy). I told her she would need to talk to him and they could decide about that and she is fully aware that there are no guarantees that she will better and accepts risks of  surgery. I dod not think an EGD would make much difference - but could do if Dr. Derrell Lolling thought needed.  Heartburn    PPI for 1-2 months then stop, if recurrent, then back to me to discuss long-term Tx. Will be interesting to see if the severe episodic epigastric pain stops - I doubt it will.                            . pantoprazole (PROTONIX) 40 MG tablet    Sig: Take 1 tablet (40 mg total) by mouth daily before breakfast.    Dispense:  30 tablet    Refill:  2     WU:JWJX, BENJAMIN, PA-C and Claud Kelp, MD

## 2013-07-22 NOTE — Progress Notes (Signed)
Patient ID: Lacey Hicks, female   DOB: 04/17/1981, 32 y.o.   MRN: [id number] Have faxed a prior authorization form for Pantoprazole 40mg  to [facility] at #[id number], awaiting to hear back.  Dx-Abdominal pain, epigastric 789.06, Heartburn 787.1.

## 2013-07-22 NOTE — Patient Instructions (Addendum)
You have been scheduled for an appointment with _____________ at [facility]. Your appointment is on ___________ at ____________. Please arrive at ___________ for registration. Make certain to bring a list of current medications, including any over the counter medications or vitamins. Also bring your co-pay if you have one as well as your insurance cards. [facility] is located at [address]. Should you need to reschedule your appointment, please contact them at [phone].  We have sent the following medications to your pharmacy for you to pick up at your convenience: pantoprazole  I appreciate the opportunity to care for you.

## 2013-07-26 ENCOUNTER — Telehealth: Payer: Self-pay | Admitting: *Deleted

## 2013-07-26 ENCOUNTER — Other Ambulatory Visit: Payer: Self-pay | Admitting: *Deleted

## 2013-07-26 MED ORDER — DEXLANSOPRAZOLE 60 MG PO CPDR: 60.0000 mg | DELAYED_RELEASE_CAPSULE | Freq: Every day | ORAL | Status: DC

## 2013-07-26 NOTE — Telephone Encounter (Signed)
Dr. Leone Payor office note:  PPI for 1-2 months then stop, if recurrent, then back to me to discuss long-term Tx. Will be interesting to see if the severe episodic epigastric pain stops - I doubt it will.   Patient's Pantoprazole was denied.  Per Darcey Nora, RN ok to try Dexilant for short term treatment. I have samples for patient(30 days worth)   Patient's phone number will not take call from blocked numbers Will send patient letter and I have the Dexilant samples for patient

## 2013-07-29 ENCOUNTER — Telehealth: Payer: Self-pay | Admitting: Internal Medicine

## 2013-07-29 NOTE — Telephone Encounter (Signed)
I called patient back and patient will be here today to pick up samples I advised patient that the samples are for a month and after taking them if her symptoms are not better to call us back  Patient verbalized understanding

## 2013-08-01 ENCOUNTER — Ambulatory Visit (INDEPENDENT_AMBULATORY_CARE_PROVIDER_SITE_OTHER): Payer: 59 | Admitting: Surgery

## 2013-08-02 ENCOUNTER — Ambulatory Visit (INDEPENDENT_AMBULATORY_CARE_PROVIDER_SITE_OTHER): Payer: 59 | Admitting: General Surgery

## 2013-08-02 ENCOUNTER — Encounter (INDEPENDENT_AMBULATORY_CARE_PROVIDER_SITE_OTHER): Payer: Self-pay | Admitting: General Surgery

## 2013-08-02 VITALS — BP 102/60 | HR 72 | Temp 97.9°F | Resp 18 | Ht 62.0 in | Wt 143.0 lb

## 2013-08-02 DIAGNOSIS — R1013 Epigastric pain: Secondary | ICD-10-CM | POA: Insufficient documentation

## 2013-08-02 NOTE — Progress Notes (Addendum)
Patient ID: Lacey Hicks, female   DOB: 05/18/1981, 32 y.o.   MRN: [id number]  Chief Complaint  Patient presents with  . Abdominal Pain    HPI Lacey Hicks is a 32 y.o. female.  She is referred back to me by Dr. Stan Head for evaluation of epigastric pain.  Lenise Herald, P.A. Provides primary care.  Dr Vincente Poli is her OB/GYN.    She is an old patient of mine. I performed hemorrhoid surgery in the past which was successful at resolving her symptoms.  She gives a 5-6 month history of intermittent episodes of severe, sharp, stabbing epigastric pain radiating to her back. This is often nocturnal but can occur on empty stomach or a full stomach. She cannot identify a specific trigger or food. This will happen every week or 2 and  last 30 minutes to 3 hours. She experiences some nausea but does not vomit.  She was initially evaluated on 04/01/2013, at the [facility]  emergency room. CBC revealed hemoglobin of 11. Liver function tests normal. Ultrasound showed no grossly abnormal findings. They did comment on a fusiform common bile duct but no stones. CT scan that day was also completely normal.  She has seen Dr. Stan Head. She tried Levsin but that did not work. She has been advised to start Dexilant(PPI),  and is supposed to start that today. She's had an MRCP which is normal. Hepatobiliary scan is completely normal with ejection fraction 78%. The only abnormality was that she subjectively reported reproduction of symptoms during CCK infusion.  Dr. Leone Payor advised her that upper endoscopy would probably not help..  It is notable that Dr. Adalberto Ill thinks that she has interstitial cystitis, and the patient has an appointment to see Dr. Molly Maduro Evans(Urology) to discuss that possibility  this November.  Family history reveals that her mother and father are well. Her mother had her gallbladder removed because it was " underactive" and she is feeling better. Maternal grandmother had  gallbladder removed for unknown reasons.  The patient's past history is significant for anxiety and depression on Zoloft, Pamelor, and Valium. She takes Percocet because of chronic neck pain. She is on Proventil inhaler and has EpiPen if she needs it.  She is asymptomatic today. HPI  Past Medical History  Diagnosis Date  . Hemorrhoids   . Constipation   . Migraines   . Urinary tract infection   . Arthritis   . Prediabetes   . Anal fissure   . Chronic neck pain   . Anxiety     Past Surgical History  Procedure Laterality Date  . Tonsillectomy    . Cesarean section  04/10/99,10/01/03,07/18/10  . Wisdom tooth extraction  1997  . Hemorroidectomy  2014  . Anal sphincterotomy  2014  . Tubal ligation    . Laparoscopic assisted vaginal hysterectomy N/A 04/14/2013    Procedure: LAPAROSCOPIC ASSISTED VAGINAL HYSTERECTOMY;  Surgeon: Jeani Hawking, MD;  Location: [facility];  Service: Gynecology;  Laterality: N/A;    Family History  Problem Relation Age of Onset  . Ovarian cancer Mother   . Diabetes Paternal Grandmother   . Heart disease Paternal Grandfather     Social History History  Substance Use Topics  . Smoking status: Former Smoker -- 2.00 packs/day for 10 years    Types: Cigarettes  . Smokeless tobacco: Former Neurosurgeon    Quit date: 08/01/2012  . Alcohol Use: No     Comment: social    Allergies  Allergen Reactions  .  Other Anaphylaxis    Tree nuts  . Wellbutrin [Bupropion Hcl] Hives and Swelling  . Cephalosporins Itching and Rash    Childhood reaction. Has taken cephalosporins as an adult and tolerated them.  . Dust Mite Extract Other (See Comments)    migraines  . Mold Extract [Trichophyton] Other (See Comments)    migraines    Current Outpatient Prescriptions  Medication Sig Dispense Refill  . albuterol (PROVENTIL HFA;VENTOLIN HFA) 108 (90 BASE) MCG/ACT inhaler Inhale 2 puffs into the lungs every 6 (six) hours as needed for wheezing.      . cetirizine (ZYRTEC)  10 MG tablet Take 10 mg by mouth daily.      Marland Kitchen dexlansoprazole (DEXILANT) 60 MG capsule Take 1 capsule (60 mg total) by mouth daily.  30 capsule  0  . diazepam (VALIUM) 10 MG tablet Take 10 mg by mouth every 6 (six) hours as needed (muscle spasms in neck).       . docusate sodium (COLACE) 100 MG capsule Take 600 mg by mouth daily as needed for constipation.      Marland Kitchen EPIPEN 2-PAK 0.3 MG/0.3ML SOAJ injection       . hyoscyamine (LEVSIN SL) 0.125 MG SL tablet Place 1 tablet (0.125 mg total) under the tongue every 4 (four) hours as needed for cramping.  60 tablet  0  . lubiprostone (AMITIZA) 24 MCG capsule Take 24 mcg by mouth daily.      . nortriptyline (PAMELOR) 50 MG capsule Take 50 mg by mouth at bedtime. Take one to two tablets      . oxyCODONE-acetaminophen (PERCOCET) 10-325 MG per tablet Take 1 tablet by mouth every 4 (four) hours as needed for pain.  60 tablet  0  . sertraline (ZOLOFT) 50 MG tablet        No current facility-administered medications for this visit.    Review of Systems Review of Systems  Constitutional: Negative for unexpected weight change.  HENT: Negative for hearing loss, congestion, trouble swallowing and voice change.   Eyes: Negative for visual disturbance.  Respiratory: Negative for wheezing.   Cardiovascular: Negative for palpitations and leg swelling.  Gastrointestinal: Negative for blood in stool, abdominal distention and anal bleeding.  Genitourinary: Positive for urgency. Negative for difficulty urinating.  Musculoskeletal: Positive for back pain and arthralgias.  Skin: Negative for rash and wound.  Neurological: Negative for seizures, syncope and headaches.  Hematological: Negative for adenopathy. Does not bruise/bleed easily.  Psychiatric/Behavioral: Negative for confusion.    Blood pressure 102/60, pulse 72, temperature 97.9 F (36.6 C), temperature source Oral, resp. rate 18, height 5\' 2"  (1.575 m), weight 143 lb (64.864 kg), last menstrual period  03/26/2013.  Physical Exam Physical Exam  Constitutional: She is oriented to person, place, and time. She appears well-developed and well-nourished. No distress.  She looks well. Pleasant. Cooperative. No distress.  HENT:  Head: Normocephalic and atraumatic.  Nose: Nose normal.  Mouth/Throat: No oropharyngeal exudate.  Eyes: Conjunctivae and EOM are normal. Pupils are equal, round, and reactive to light. Left eye exhibits no discharge. No scleral icterus.  Neck: Neck supple. No JVD present. No tracheal deviation present. No thyromegaly present.  Cardiovascular: Normal rate, regular rhythm, normal heart sounds and intact distal pulses.   No murmur heard. Pulmonary/Chest: Effort normal and breath sounds normal. No respiratory distress. She has no wheezes. She has no rales. She exhibits no tenderness.  Abdominal: Soft. Bowel sounds are normal. She exhibits no distension and no mass. There is no tenderness.  There is no rebound and no guarding.  Benign abdominal exam. No tenderness to percuss costal margins or spine.  Musculoskeletal: She exhibits no edema and no tenderness.  Lymphadenopathy:    She has no cervical adenopathy.  Neurological: She is alert and oriented to person, place, and time. She exhibits normal muscle tone. Coordination normal.  Skin: Skin is warm. No rash noted. She is not diaphoretic. No erythema. No pallor.  Psychiatric: She has a normal mood and affect. Her behavior is normal. Judgment and thought content normal.    Data Reviewed Extensive records from [facility], and numerous lab test and imaging studies.  Assessment    Epigastric and back pain, intermittent, etiology unclear. Extensive workup unrevealing, and suggest that she does not have gallbladder disease.  Symptomatically, the pain is not classic for biliary colic, but it is not inconsistent. Other problems including esophageal spasm and gastric pathology could be applied here. Extensive workup fails to confirm  any hepatic or biliary or pancreatic etiology.  Chronic cervical spine  pain, on Percocet  Chronic anxiety, on prescription medication   Asthma  Evaluation for interstitial cystitis pending.    Plan    I advised against cholecystectomy at this point in time.  I told her that the chance of this resolving her symptoms was unknown, but probably less than 50%. I told her we would keep an open mind about this as time went on.  I told her that the next step for her was a 4 week trial of  proton pump inhibitors, and she states she will start that today  We'll schedule for an upper GI just in case  We might  uncover esophageal or gastric pathology  Advised her to keep her appointment with Marcelyn Bruins regarding her interstitial cystitis  I advised her to keep her followup with Dr. Stan Head  She will return to see me in 2 months if she remains symptomatic.        Angelia Mould. Derrell Lolling, M.D., [facility] General and Minimally invasive Surgery Breast and Colorectal Surgery Office:   [phone] Pager:   [phone]  08/02/2013, 12:23 PM

## 2013-08-02 NOTE — Patient Instructions (Signed)
The cause of your upper abdominal pain which radiates to your back it is not clear. You have  had a fairly extensive workup, and the only abnormal finding is that you reported pain after getting a CCK injection during the biliary scan.  I do not recommend gallbladder surgery at this point in time. The probability of surgery relieving your  symptoms is unknown, but probably less than 50%.  The next step is for used to take the dexilant medication, which is a proton pump inhibitor for at least one month.  I will also schedule you for an upper GI to make sure that she do not have an esophageal or gastric problem that can be seen on x-ray.  Keep your appointment in November  with Dr. Logan Bores to investigate you for interstitial cystitis.  Return to see Dr. Derrell Lolling in 2 months if you are still symptomatic.

## 2013-08-03 ENCOUNTER — Ambulatory Visit
Admission: RE | Admit: 2013-08-03 | Discharge: 2013-08-03 | Disposition: A | Discharge status: Home / Self Care | Payer: 59 | Source: Ambulatory Visit | Attending: General Surgery | Admitting: General Surgery

## 2013-08-03 ENCOUNTER — Encounter (INDEPENDENT_AMBULATORY_CARE_PROVIDER_SITE_OTHER): Payer: Self-pay | Admitting: General Surgery

## 2013-08-03 ENCOUNTER — Other Ambulatory Visit (INDEPENDENT_AMBULATORY_CARE_PROVIDER_SITE_OTHER): Payer: Self-pay | Admitting: General Surgery

## 2013-08-03 DIAGNOSIS — R1013 Epigastric pain: Secondary | ICD-10-CM

## 2013-08-12 ENCOUNTER — Telehealth: Payer: Self-pay | Admitting: Internal Medicine

## 2013-08-12 NOTE — Telephone Encounter (Signed)
F/u

## 2013-08-17 ENCOUNTER — Encounter (INDEPENDENT_AMBULATORY_CARE_PROVIDER_SITE_OTHER): Payer: Self-pay | Admitting: General Surgery

## 2013-08-17 ENCOUNTER — Encounter: Payer: Self-pay | Admitting: Internal Medicine

## 2013-08-17 ENCOUNTER — Telehealth: Payer: Self-pay | Admitting: Internal Medicine

## 2013-08-17 NOTE — Telephone Encounter (Signed)
Unable to reach the patient by phone.  Her phone has a block on it.  I did send her another My Chart message

## 2013-08-18 NOTE — Telephone Encounter (Signed)
Patient is scheduled to see Dr. Leone Payor on 08/25/13

## 2013-08-25 ENCOUNTER — Encounter: Payer: Self-pay | Admitting: Internal Medicine

## 2013-08-25 ENCOUNTER — Ambulatory Visit (INDEPENDENT_AMBULATORY_CARE_PROVIDER_SITE_OTHER): Payer: 59 | Admitting: Internal Medicine

## 2013-08-25 VITALS — BP 100/60 | HR 66 | Ht 62.0 in | Wt 148.4 lb

## 2013-08-25 DIAGNOSIS — K648 Other hemorrhoids: Secondary | ICD-10-CM

## 2013-08-25 DIAGNOSIS — R1011 Right upper quadrant pain: Secondary | ICD-10-CM

## 2013-08-25 DIAGNOSIS — R1013 Epigastric pain: Secondary | ICD-10-CM

## 2013-08-25 DIAGNOSIS — K589 Irritable bowel syndrome without diarrhea: Secondary | ICD-10-CM

## 2013-08-25 MED ORDER — LINACLOTIDE 290 MCG PO CAPS: 290.0000 ug | ORAL_CAPSULE | Freq: Every day | ORAL | Status: DC

## 2013-08-25 MED ORDER — PANTOPRAZOLE SODIUM 40 MG PO TBEC: 40.0000 mg | DELAYED_RELEASE_TABLET | Freq: Every day | ORAL | Status: DC

## 2013-08-25 MED ORDER — NORTRIPTYLINE HCL 25 MG PO CAPS: 25.0000 mg | ORAL_CAPSULE | Freq: Every day | ORAL | Status: DC

## 2013-08-25 MED ORDER — ATOMOXETINE HCL 80 MG PO CAPS: 80.0000 mg | ORAL_CAPSULE | Freq: Every day | ORAL | Status: DC

## 2013-08-25 NOTE — Patient Instructions (Addendum)
Taper off your Zoloft.  We have sent the following medications to your pharmacy for you to pick up at your convenience: Nortriptyline  Today we are giving you samples of the Linzess 290 mcg., take one capsules daily before breakfast on an empty stomach.  We are also giving you a written rx to take to the pharmacy if this works for your constipation.  We are also providing you with a written rx for pantoprazole to fill after your done with the dexilant samples.  Stop the amitiza per Dr. Leone Payor.  Follow up with [place] in 2 months.   I appreciate the opportunity to care for you.

## 2013-08-25 NOTE — Assessment & Plan Note (Signed)
Her upper abdominal pain seems to be gone. It could be nortriptyline, it could be Dexilant. In light of her IBS symptoms that I was not aware of before, I think it makes good sense to avoid cholecystectomy at this time and she agrees.  I'm going to simplify her medication regimen a little bit. She will reduced nortriptyline 25 mg and take it at bedtime rather than the day. She is going to taper off Zoloft. Really changed Dexilant the pantoprazole 40 mg daily and I wonder she might not be able to come off as I think she is getting most benefit from tricyclic agent at this point. She will stop Amitiza and try Linzess 290 mcg daily, samples are provided at this time. She will return in 2 months. Sooner if needed.

## 2013-08-25 NOTE — Progress Notes (Signed)
  Subjective:    Patient ID: Lacey Hicks, female    DOB: [birth year], 32 y.o.   MRN: [id number]  HPI The patient is here for followup. She had a surgical consultation, Dr. Derrell Lolling thought observation and trial of PPI make sense rather than proceeding to cholecystectomy. She is actually done well since then with no right upper quadrant pain. She has been on a trial of Dexilant 60 mg daily, she is also been started on nortriptyline and sertraline. She had come off her Strattera due to cost but has had to go back on that because she can function better with that on board. She is having variable bowel movements. Mostly constipated but some loose at times, she recalls eating a spinach salad with some reddish dressing on it and had mild diarrhea for several episodes. She has been told she might have interstitial cystitis and is awaiting a urology evaluation with Dr. Logan Bores. With frequent loose stools she is noted a slight amount of rectal bleeding again.   Review of Systems As above.    Objective:   Physical Exam General:  NAD Eyes:   anicteric Abdomen:  soft and nontender, BS+ Ext:   no edema     Assessment & Plan:   1. IBS (irritable bowel syndrome)   2. RUQ pain

## 2013-08-25 NOTE — Assessment & Plan Note (Signed)
It certainly sounds like she has this and that may be responsible for her upper abdominal pain as well. Plans as written out in the epigastric pain assessment. I'm going to simplify her medication regimen a little bit. She will reduced nortriptyline 25 mg and take it at bedtime rather than the day. She is going to taper off Zoloft. Really changed Dexilant the pantoprazole 40 mg daily and I wonder she might not be able to come off as I think she is getting most benefit from tricyclic agent at this point. She will stop Amitiza and try Linzess 290 mcg daily, samples are provided at this time. She will return in 2 months. Sooner if needed.

## 2013-08-25 NOTE — Assessment & Plan Note (Signed)
She may be having a bit more bleeding was frequent stools but the bowel habits get improved these should calm down.

## 2013-09-09 ENCOUNTER — Encounter: Payer: Self-pay | Admitting: Internal Medicine

## 2013-09-09 NOTE — Progress Notes (Signed)
Patient ID: Lacey Hicks, female   DOB: 1981/10/18, 32 y.o.   MRN: [id number] Faxed prior authorization forms to [employer] at fax # [phone] for Linzess 290 mcg and pantoprazole sodium 40 mg, MW:[id number] 787.1, abdominal pain, epigastric 789.06, IBS 564.1, RUQ pain 789.01.

## 2013-09-13 ENCOUNTER — Other Ambulatory Visit: Payer: Self-pay | Admitting: Internal Medicine

## 2013-09-13 ENCOUNTER — Encounter: Payer: Self-pay | Admitting: Internal Medicine

## 2013-09-13 MED ORDER — LINACLOTIDE 290 MCG PO CAPS: 290.0000 ug | ORAL_CAPSULE | Freq: Every day | ORAL | Status: DC

## 2013-09-21 ENCOUNTER — Encounter: Payer: Self-pay | Admitting: Internal Medicine

## 2013-09-21 NOTE — Progress Notes (Signed)
Patient ID: Lacey Hicks, female   DOB: 1980-11-28, 32 y.o.   MRN: [id number] Received fax approving Linzess thru 03/10/2014 from [employer].  However they sent fax denying the Pantoprazole 40mg .  The [facility] Daiva Nakayama at [phone] has been notified and she said they will notify the patient.  Will consult with Dr. Leone Payor regarding the PPI rx replacement.

## 2013-09-23 ENCOUNTER — Telehealth: Payer: Self-pay

## 2013-09-23 NOTE — Telephone Encounter (Signed)
Per Dr. Leone Payor I spoke to patient and she is currently off PPI's so that is what he was going to have her try anyway.  If she feels she needs one I've instructed her to call her insurance company and see what they will cover since the pantoprazole was denied.

## 2013-09-29 ENCOUNTER — Other Ambulatory Visit: Payer: Self-pay

## 2013-09-29 ENCOUNTER — Ambulatory Visit (INDEPENDENT_AMBULATORY_CARE_PROVIDER_SITE_OTHER): Payer: 59 | Admitting: General Surgery

## 2013-09-29 ENCOUNTER — Encounter (INDEPENDENT_AMBULATORY_CARE_PROVIDER_SITE_OTHER): Payer: Self-pay | Admitting: General Surgery

## 2013-09-29 VITALS — BP 98/60 | HR 96 | Temp 98.4°F | Resp 16 | Ht 62.0 in | Wt 147.2 lb

## 2013-09-29 DIAGNOSIS — K644 Residual hemorrhoidal skin tags: Secondary | ICD-10-CM

## 2013-09-29 DIAGNOSIS — K648 Other hemorrhoids: Secondary | ICD-10-CM

## 2013-09-29 DIAGNOSIS — K602 Anal fissure, unspecified: Secondary | ICD-10-CM

## 2013-09-29 DIAGNOSIS — R1013 Epigastric pain: Secondary | ICD-10-CM

## 2013-09-29 DIAGNOSIS — K589 Irritable bowel syndrome without diarrhea: Secondary | ICD-10-CM

## 2013-09-29 NOTE — Progress Notes (Signed)
Patient ID: Lacey Hicks, female   DOB: 1981-05-18, 32 y.o.   MRN: [id number]  History: This patient returns for followup of her epigastric abdominal pain and also for recurrent low volume rectal bleeding. This started after taking a laxative for the upper GI. She has some discomfort but no protrusion or prolapse. I performed internal/external hemorrhoidectomy within the last year, and she recovered uneventfully. She has had an extensive workup for epigastric pain. Her upper GI is normal. Dr. Leone Payor started her on linzess for her irritable bowel syndrome and her pain is now completely resolved.  ROS: 10 system review of systems is negative except as described above.Recent bunion surgery right foot.  Exam: Patient is alert. No distress Abdomen soft and nontender. No distention Rectal external anal exam shows normal skin. Minimal skin tags. No thrombosis or infection. No fistula. No fissure. Digital rectal exam reveals normal sphincter tone. No focal areas of tenderness. No blood. Anoscopy reveals fairly healthy rectal mucosa. There were some small internal hemorrhoids on the left side and to a lesser extent on the right and I injected these conservatively. No other pathology seen  Assessment: Internal hemorrhoids with bleeding. Injected today with sclerosing solution History of hemorrhoidectomy with normal healing on physical exam and anoscopy Irritable bowel syndrome, epigastric pain resolved on linzess  Plan: If the anal bleeding  does not completely stop a few days, then the explanation is unclear and she should see Dr. Faustino Congress for possible colonoscopy Return to see me as needed.    \Breylin Dom M. Derrell Lolling, M.D., [facility] General and Minimally invasive Surgery Breast and Colorectal Surgery Office:   [phone] Pager:   [phone]

## 2013-09-29 NOTE — Patient Instructions (Signed)
Because of your recurrent rectal bleeding, we performed anoscopy today and saw some small internal hemorrhoids which were injected with sclerosing solution.Basically it looked like he had healed well from her hemorrhoid surgery  If the bleeding stops, then nothing further needs to be done.  If the bleeding  returns or persists, you'll need to see Dr. Leone Payor for possible colonoscopy  I am delighted that your abdominal pain has resolved with the new treatment for your irritable bowel syndrome.  Return to see Dr. Derrell Lolling as needed.

## 2014-09-25 ENCOUNTER — Encounter (INDEPENDENT_AMBULATORY_CARE_PROVIDER_SITE_OTHER): Payer: Self-pay | Admitting: General Surgery

## 2015-09-12 ENCOUNTER — Emergency Department (HOSPITAL_COMMUNITY): Payer: 59

## 2015-09-12 ENCOUNTER — Encounter (HOSPITAL_COMMUNITY): Payer: Self-pay | Admitting: Emergency Medicine

## 2015-09-12 ENCOUNTER — Emergency Department (HOSPITAL_COMMUNITY)
Admission: EM | Admit: 2015-09-12 | Discharge: 2015-09-12 | Disposition: A | Discharge status: Home / Self Care | Payer: 59 | Attending: Emergency Medicine | Admitting: Emergency Medicine

## 2015-09-12 DIAGNOSIS — R51 Headache: Secondary | ICD-10-CM

## 2015-09-12 DIAGNOSIS — H532 Diplopia: Secondary | ICD-10-CM | POA: Diagnosis not present

## 2015-09-12 DIAGNOSIS — R0602 Shortness of breath: Secondary | ICD-10-CM | POA: Diagnosis not present

## 2015-09-12 DIAGNOSIS — R002 Palpitations: Secondary | ICD-10-CM

## 2015-09-12 DIAGNOSIS — Z8739 Personal history of other diseases of the musculoskeletal system and connective tissue: Secondary | ICD-10-CM | POA: Insufficient documentation

## 2015-09-12 DIAGNOSIS — F419 Anxiety disorder, unspecified: Secondary | ICD-10-CM | POA: Insufficient documentation

## 2015-09-12 DIAGNOSIS — Z87891 Personal history of nicotine dependence: Secondary | ICD-10-CM | POA: Insufficient documentation

## 2015-09-12 DIAGNOSIS — Z79899 Other long term (current) drug therapy: Secondary | ICD-10-CM | POA: Diagnosis not present

## 2015-09-12 DIAGNOSIS — G8929 Other chronic pain: Secondary | ICD-10-CM | POA: Insufficient documentation

## 2015-09-12 DIAGNOSIS — K5900 Constipation, unspecified: Secondary | ICD-10-CM | POA: Diagnosis not present

## 2015-09-12 DIAGNOSIS — Z87440 Personal history of urinary (tract) infections: Secondary | ICD-10-CM | POA: Diagnosis not present

## 2015-09-12 DIAGNOSIS — G43909 Migraine, unspecified, not intractable, without status migrainosus: Secondary | ICD-10-CM | POA: Diagnosis not present

## 2015-09-12 DIAGNOSIS — R519 Headache, unspecified: Secondary | ICD-10-CM

## 2015-09-12 DIAGNOSIS — H5710 Ocular pain, unspecified eye: Secondary | ICD-10-CM | POA: Insufficient documentation

## 2015-09-12 LAB — CBC WITH DIFFERENTIAL/PLATELET
BASOS PCT: 0 %
Basophils Absolute: 0 10*3/uL (ref 0.0–0.1)
EOS ABS: 0.2 10*3/uL (ref 0.0–0.7)
EOS PCT: 3 %
HCT: 41.7 % (ref 36.0–46.0)
Hemoglobin: 14.7 g/dL (ref 12.0–15.0)
Lymphocytes Relative: 39 %
Lymphs Abs: 3 10*3/uL (ref 0.7–4.0)
MCH: 32.2 pg (ref 26.0–34.0)
MCHC: 35.3 g/dL (ref 30.0–36.0)
MCV: 91.4 fL (ref 78.0–100.0)
MONO ABS: 0.7 10*3/uL (ref 0.1–1.0)
MONOS PCT: 9 %
Neutro Abs: 3.8 10*3/uL (ref 1.7–7.7)
Neutrophils Relative %: 49 %
PLATELETS: 283 10*3/uL (ref 150–400)
RBC: 4.56 MIL/uL (ref 3.87–5.11)
RDW: 11.7 % (ref 11.5–15.5)
WBC: 7.7 10*3/uL (ref 4.0–10.5)

## 2015-09-12 LAB — COMPREHENSIVE METABOLIC PANEL
ALK PHOS: 60 U/L (ref 38–126)
ALT: 11 U/L — ABNORMAL LOW (ref 14–54)
ANION GAP: 9 (ref 5–15)
AST: 16 U/L (ref 15–41)
Albumin: 4.8 g/dL (ref 3.5–5.0)
BUN: 10 mg/dL (ref 6–20)
CALCIUM: 9.4 mg/dL (ref 8.9–10.3)
CHLORIDE: 107 mmol/L (ref 101–111)
CO2: 25 mmol/L (ref 22–32)
Creatinine, Ser: 0.68 mg/dL (ref 0.44–1.00)
GFR calc non Af Amer: >60 mL/min (ref >60)
Glucose, Bld: 90 mg/dL (ref 65–99)
POTASSIUM: 4.3 mmol/L (ref 3.5–5.1)
SODIUM: 141 mmol/L (ref 135–145)
TOTAL PROTEIN: 7.9 g/dL (ref 6.5–8.1)
Total Bilirubin: 0.6 mg/dL (ref 0.3–1.2)

## 2015-09-12 LAB — TROPONIN I

## 2015-09-12 MED ORDER — DIPHENHYDRAMINE HCL 50 MG/ML IJ SOLN
25.0000 mg | Freq: Once | INTRAMUSCULAR | Status: AC
  Administered 2015-09-12: 25 mg via INTRAVENOUS
  Filled 2015-09-12: qty 1

## 2015-09-12 MED ORDER — PROCHLORPERAZINE EDISYLATE 5 MG/ML IJ SOLN
10.0000 mg | Freq: Once | INTRAMUSCULAR | Status: AC
  Administered 2015-09-12: 10 mg via INTRAVENOUS
  Filled 2015-09-12: qty 2

## 2015-09-12 NOTE — ED Notes (Signed)
Patient transported to MRI 

## 2015-09-12 NOTE — ED Provider Notes (Addendum)
CSN: [id number]     Arrival date & time 09/12/15  1759 History   First MD Initiated Contact with Patient 09/12/15 1909     Chief Complaint  Patient presents with  . Blurred Vision     (Consider location/radiation/quality/duration/timing/severity/associated sxs/prior Treatment) Patient is a 34 y.o. female presenting with eye problem, headaches, and palpitations.  Eye Problem Location:  Both Severity:  Moderate Onset quality:  Gradual Timing:  Intermittent Progression:  Waxing and waning Chronicity:  New Context: not direct trauma   Relieved by:  Nothing Exacerbated by: standing up, sitting up. Associated symptoms: blurred vision ("haze), double vision (continues to be present if close one eye, not currently present) and headaches   Associated symptoms: no nausea, no numbness, no vomiting and no weakness   Headache Pain location:  Frontal Quality: throbbing. Onset quality:  Gradual Timing:  Constant Chronicity:  New Similar to prior headaches: no   Relieved by:  Nothing Worsened by:  Light Associated symptoms: blurred vision ("haze) and eye pain   Associated symptoms: no abdominal pain, no back pain, no cough, no fever, no nausea, no neck pain, no numbness, no sore throat, no vomiting and no weakness   Palpitations Palpitations quality:  Fast Onset quality:  Sudden Duration: lasts seconds-minutes. Timing:  Intermittent Progression:  Waxing and waning Chronicity:  New Associated symptoms: shortness of breath   Associated symptoms: no back pain, no chest pain, no cough, no leg pain, no nausea, no numbness, no vomiting and no weakness     Past Medical History  Diagnosis Date  . Hemorrhoids   . Constipation   . Migraines   . Urinary tract infection   . Arthritis   . Prediabetes   . Anal fissure   . Chronic neck pain   . Anxiety    Past Surgical History  Procedure Laterality Date  . Tonsillectomy    . Cesarean section  04/10/99,10/01/03,07/18/10  . Wisdom tooth  extraction  1997  . Hemorroidectomy  2014  . Anal sphincterotomy  2014  . Tubal ligation    . Laparoscopic assisted vaginal hysterectomy N/A 04/14/2013    Procedure: LAPAROSCOPIC ASSISTED VAGINAL HYSTERECTOMY;  Surgeon: Jeani Hawking, MD;  Location: [facility];  Service: Gynecology;  Laterality: N/A;  . Bunionectomy     Family History  Problem Relation Age of Onset  . Ovarian cancer Mother   . Diabetes Paternal Grandmother   . Heart disease Paternal Grandfather   . Colon cancer Neg Hx    Social History  Substance Use Topics  . Smoking status: Former Smoker -- 2.00 packs/day for 10 years    Types: Cigarettes  . Smokeless tobacco: Former Neurosurgeon    Quit date: 08/01/2012  . Alcohol Use: No     Comment: social   OB History    Gravida Para Term Preterm AB TAB SAB Ectopic Multiple Living   4 3   1  1   3      Review of Systems  Constitutional: Negative for fever.  HENT: Negative for sore throat.   Eyes: Positive for blurred vision ("haze), double vision (continues to be present if close one eye, not currently present), pain and visual disturbance.  Respiratory: Positive for shortness of breath. Negative for cough.   Cardiovascular: Positive for palpitations. Negative for chest pain.  Gastrointestinal: Negative for nausea, vomiting and abdominal pain.  Genitourinary: Negative for difficulty urinating.  Musculoskeletal: Negative for back pain and neck pain.  Skin: Negative for rash.  Neurological: Positive for headaches.  Negative for syncope, weakness and numbness.      Allergies  Other; Wellbutrin; Cephalosporins; Dust mite extract; and Mold extract  Home Medications   Prior to Admission medications   Medication Sig Start Date End Date Taking? Authorizing Provider  albuterol (PROVENTIL HFA;VENTOLIN HFA) 108 (90 BASE) MCG/ACT inhaler Inhale 2 puffs into the lungs every 6 (six) hours as needed for wheezing.    Historical Provider, MD  atomoxetine (STRATTERA) 80 MG capsule Take 1  capsule (80 mg total) by mouth daily. 08/25/13   Iva Boop, MD  cetirizine (ZYRTEC) 10 MG tablet Take 10 mg by mouth daily.    Historical Provider, MD  dexlansoprazole (DEXILANT) 60 MG capsule Take 1 capsule (60 mg total) by mouth daily. 07/26/13   Mardella Layman, MD  diazepam (VALIUM) 10 MG tablet Take 10 mg by mouth every 6 (six) hours as needed (muscle spasms in neck).     Historical Provider, MD  docusate sodium (COLACE) 100 MG capsule Take 600 mg by mouth daily as needed for constipation.    Historical Provider, MD  EPIPEN 2-PAK 0.3 MG/0.3ML SOAJ injection  06/30/13   Historical Provider, MD  HYDROmorphone (DILAUDID) 2 MG tablet Take by mouth every 4 (four) hours as needed for severe pain.    Historical Provider, MD  Linaclotide [facility]) 290 MCG CAPS capsule Take 1 capsule (290 mcg total) by mouth daily before breakfast. 08/25/13   Iva Boop, MD  Linaclotide [facility]) 290 MCG CAPS capsule Take 1 capsule (290 mcg total) by mouth daily. 08/25/13   Iva Boop, MD  Linaclotide [facility]) 290 MCG CAPS capsule Take 1 capsule (290 mcg total) by mouth daily. 09/13/13   Iva Boop, MD  nortriptyline (PAMELOR) 25 MG capsule Take 1 capsule (25 mg total) by mouth at bedtime. 08/25/13   Iva Boop, MD  oxyCODONE-acetaminophen (PERCOCET) 10-325 MG per tablet Take 1 tablet by mouth every 4 (four) hours as needed for pain.    Historical Provider, MD  pantoprazole (PROTONIX) 40 MG tablet Take 1 tablet (40 mg total) by mouth daily before breakfast. 08/25/13   Iva Boop, MD  sertraline (ZOLOFT) 50 MG tablet  06/24/13   Historical Provider, MD   BP 111/75 mmHg  Pulse 84  Temp(Src) 98.1 F (36.7 C) (Oral)  Resp 20  Ht  (1.575 m)  Wt 125 lb (56.7 kg)  BMI 22.86 kg/m2  SpO2 100%  LMP 03/26/2013 Physical Exam  Constitutional: She is oriented to person, place, and time. She appears well-developed and well-nourished. No distress.  HENT:  Head: Normocephalic and atraumatic.  Eyes:  Conjunctivae and EOM are normal. Pupils are equal, round, and reactive to light. Right conjunctiva is not injected. Left conjunctiva is not injected. Right eye exhibits normal extraocular motion. Left eye exhibits normal extraocular motion.  Normal peripheral vision  Neck: Normal range of motion.  Cardiovascular: Normal rate, regular rhythm, normal heart sounds and intact distal pulses.  Exam reveals no gallop and no friction rub.   No murmur heard. Pulmonary/Chest: Effort normal and breath sounds normal. No respiratory distress. She has no wheezes. She has no rales.  Abdominal: Soft. She exhibits no distension. There is no tenderness. There is no guarding.  Musculoskeletal: She exhibits no edema or tenderness.  Neurological: She is alert and oriented to person, place, and time. She has normal strength. No cranial nerve deficit or sensory deficit. She displays a negative Romberg sign. Coordination and gait normal. GCS eye subscore is  4. GCS verbal subscore is 5. GCS motor subscore is 6.  Skin: Skin is warm and dry. No rash noted. She is not diaphoretic. No erythema.  Nursing note and vitals reviewed.   ED Course  Procedures (including critical care time) Labs Review Labs Reviewed  COMPREHENSIVE METABOLIC PANEL - Abnormal; Notable for the following:    ALT 11 (*)    All other components within normal limits  CBC WITH DIFFERENTIAL/PLATELET  TROPONIN I    Imaging Review Dg Chest 2 View  09/12/2015  CLINICAL DATA:  Heart blurring, shortness of breath. EXAM: CHEST  2 VIEW COMPARISON:  None. FINDINGS: Trachea is midline. Heart size normal. Lungs are clear. No pleural fluid. IMPRESSION: No acute findings. Electronically Signed   By: Leanna BattlesMelinda  Blietz M.D.   On: 09/12/2015 21:54   Mr Brain Wo Contrast (neuro Protocol)  09/12/2015  CLINICAL DATA:  Progressive loss of peripheral vision. Dizziness. Tachycardia. Abnormal CT scan. EXAM: MRI HEAD WITHOUT CONTRAST TECHNIQUE: Multiplanar, multiecho  pulse sequences of the brain and surrounding structures were obtained without intravenous contrast. COMPARISON:  CT head without contrast 11/19/2014. FINDINGS: No acute infarct, hemorrhage, or mass lesion is present. The ventricles are of normal size. No significant extraaxial fluid collection is present. No significant white matter disease is present. The internal auditory canals are within normal limits. Flow was present in the major intracranial arteries. The brainstem is unremarkable. Globes and orbits are intact. Mild mucosal disease is present in the anterior ethmoid air cells and frontal sinuses. A polyp or mucous retention cyst is evident anteriorly in the right maxillary sinus. Skullbase is within normal limits. Midline structures are unremarkable. IMPRESSION: 1. Normal MRI appearance the brain. 2. No significant white matter disease. 3. Anterior ethmoid and frontal sinus disease. Electronically Signed   By: Marin Robertshristopher  Mattern M.D.   On: 09/12/2015 20:13   I have personally reviewed and evaluated these images and lab results as part of my medical decision-making.   EKG Interpretation None      MDM   Final diagnoses:  Heart palpitations  Double vision  Acute nonintractable headache, unspecified headache type   33yo female with hx of anxiety presents with concern of palpitations, HA, double vision.  Pt was recently told by optometrist that she had optic disc swelling and was referred to neuroophthalmologist however due to insurance needed referral from PCP and was concerned regarding speed of follow up and presented to ED for multiple symptoms as well as concern regarding recent eye exam findings.  Pt describes decreased vision, with acuity 20/40 and 20/30, normal peripheral vision and extraocular movements on exam. 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 Discussed  other etiologies of optic disc edema would be benign intracranial hypertension, however given no findings to suggest imminent vision loss or need for emergent LP and recommend close outpatient follow up with neuroophthalmologist (first to confirm findings of edema on dilated exam) and with neurologist.  Pt given migraine cocktail with HA improvement.    Regarding palpitations,  EKG evaluated by me and shows sinus rhythm with no sign of prolonged QTc, no brugada, no sign of HOCM, no ST abnormalities.   No sign of arrhythmia on monitor. Negative troponin, CXR without acute findings. Pt is PERC negative, doubt PE.  Recommend PCP follow up. Patient discharged in stable condition with understanding of reasons to return.     Alvira MondayErin Irlanda Croghan, MD  09/13/15 1130  Alvira Monday, MD 09/13/15 1131

## 2015-09-12 NOTE — ED Notes (Signed)
Pt states that last week she went to have her eyes examined and was informed that she had swollen optic disc and "increased cerebal fluid in my eye"  Was instructed to see an specialist but due to insurance she had to wait for a referal from her primary MD before being able to go to see specialist - pt states that she has lost her peripheral vision and also co feeling fluttering in her chest .

## 2015-11-20 ENCOUNTER — Other Ambulatory Visit: Payer: Self-pay | Admitting: Ophthalmology

## 2015-11-20 DIAGNOSIS — H209 Unspecified iridocyclitis: Secondary | ICD-10-CM

## 2016-04-16 ENCOUNTER — Other Ambulatory Visit (HOSPITAL_COMMUNITY): Payer: Self-pay | Admitting: Emergency Medicine

## 2016-04-16 ENCOUNTER — Ambulatory Visit (HOSPITAL_COMMUNITY)
Admission: RE | Admit: 2016-04-16 | Discharge: 2016-04-16 | Disposition: A | Discharge status: Home / Self Care | Payer: 59 | Source: Ambulatory Visit | Attending: *Deleted | Admitting: *Deleted

## 2016-04-16 ENCOUNTER — Other Ambulatory Visit (HOSPITAL_COMMUNITY): Payer: Self-pay | Admitting: *Deleted

## 2016-04-16 ENCOUNTER — Ambulatory Visit (HOSPITAL_COMMUNITY)
Admission: RE | Admit: 2016-04-16 | Discharge: 2016-04-16 | Disposition: A | Discharge status: Home / Self Care | Payer: 59 | Source: Ambulatory Visit | Attending: Emergency Medicine | Admitting: Emergency Medicine

## 2016-04-16 DIAGNOSIS — S161XXA Strain of muscle, fascia and tendon at neck level, initial encounter: Secondary | ICD-10-CM | POA: Insufficient documentation

## 2016-04-16 DIAGNOSIS — R0789 Other chest pain: Secondary | ICD-10-CM | POA: Insufficient documentation

## 2016-04-16 DIAGNOSIS — S139XXA Sprain of joints and ligaments of unspecified parts of neck, initial encounter: Secondary | ICD-10-CM | POA: Insufficient documentation

## 2016-04-16 DIAGNOSIS — Y929 Unspecified place or not applicable: Secondary | ICD-10-CM | POA: Insufficient documentation

## 2016-04-16 DIAGNOSIS — Y939 Activity, unspecified: Secondary | ICD-10-CM | POA: Insufficient documentation

## 2016-04-16 DIAGNOSIS — Y999 Unspecified external cause status: Secondary | ICD-10-CM | POA: Insufficient documentation

## 2016-04-16 DIAGNOSIS — S20219A Contusion of unspecified front wall of thorax, initial encounter: Secondary | ICD-10-CM | POA: Diagnosis not present

## 2016-04-16 MED ORDER — IOPAMIDOL (ISOVUE-300) INJECTION 61%
100.0000 mL | Freq: Once | INTRAVENOUS | Status: AC | PRN
  Administered 2016-04-16: 100 mL via INTRAVENOUS

## 2016-04-18 ENCOUNTER — Encounter (HOSPITAL_COMMUNITY): Payer: Self-pay | Admitting: *Deleted

## 2016-04-18 ENCOUNTER — Emergency Department (HOSPITAL_COMMUNITY): Payer: 59

## 2016-04-18 ENCOUNTER — Emergency Department (HOSPITAL_COMMUNITY)
Admission: EM | Admit: 2016-04-18 | Discharge: 2016-04-18 | Disposition: A | Discharge status: Home / Self Care | Payer: 59 | Attending: Emergency Medicine | Admitting: Emergency Medicine

## 2016-04-18 DIAGNOSIS — Z7982 Long term (current) use of aspirin: Secondary | ICD-10-CM | POA: Diagnosis not present

## 2016-04-18 DIAGNOSIS — Z87891 Personal history of nicotine dependence: Secondary | ICD-10-CM | POA: Diagnosis not present

## 2016-04-18 DIAGNOSIS — M62838 Other muscle spasm: Secondary | ICD-10-CM | POA: Insufficient documentation

## 2016-04-18 DIAGNOSIS — M545 Low back pain, unspecified: Secondary | ICD-10-CM

## 2016-04-18 DIAGNOSIS — Z79899 Other long term (current) drug therapy: Secondary | ICD-10-CM | POA: Diagnosis not present

## 2016-04-18 DIAGNOSIS — E119 Type 2 diabetes mellitus without complications: Secondary | ICD-10-CM | POA: Diagnosis not present

## 2016-04-18 DIAGNOSIS — M1990 Unspecified osteoarthritis, unspecified site: Secondary | ICD-10-CM | POA: Diagnosis not present

## 2016-04-18 HISTORY — DX: Type 2 diabetes mellitus without complications: E11.9

## 2016-04-18 MED ORDER — HYDROCODONE-ACETAMINOPHEN 5-325 MG PO TABS: ORAL_TABLET | ORAL | Status: AC

## 2016-04-18 MED ORDER — METHOCARBAMOL 500 MG PO TABS: 500.0000 mg | ORAL_TABLET | Freq: Three times a day (TID) | ORAL | Status: DC

## 2016-04-18 NOTE — Discharge Instructions (Signed)

## 2016-04-18 NOTE — ED Notes (Signed)
Patient given prepack of 6 tablets Hydrocodone, with Triplett, PA as witness
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
## 2016-05-02 MED FILL — Hydrocodone-Acetaminophen Tab 5-325 MG: ORAL | Qty: 6 | Status: AC

## 2016-07-22 ENCOUNTER — Emergency Department (HOSPITAL_COMMUNITY)
Admission: EM | Admit: 2016-07-22 | Discharge: 2016-07-23 | Disposition: A | Discharge status: Home / Self Care | Payer: 59 | Attending: Emergency Medicine | Admitting: Emergency Medicine

## 2016-07-22 ENCOUNTER — Emergency Department (HOSPITAL_COMMUNITY): Payer: 59

## 2016-07-22 ENCOUNTER — Encounter (HOSPITAL_COMMUNITY): Payer: Self-pay | Admitting: Emergency Medicine

## 2016-07-22 DIAGNOSIS — Z87891 Personal history of nicotine dependence: Secondary | ICD-10-CM | POA: Insufficient documentation

## 2016-07-22 DIAGNOSIS — Z79899 Other long term (current) drug therapy: Secondary | ICD-10-CM | POA: Insufficient documentation

## 2016-07-22 DIAGNOSIS — M542 Cervicalgia: Secondary | ICD-10-CM | POA: Diagnosis present

## 2016-07-22 DIAGNOSIS — R200 Anesthesia of skin: Secondary | ICD-10-CM | POA: Diagnosis not present

## 2016-07-22 DIAGNOSIS — E119 Type 2 diabetes mellitus without complications: Secondary | ICD-10-CM | POA: Diagnosis not present

## 2016-07-22 MED ORDER — OXYCODONE-ACETAMINOPHEN 5-325 MG PO TABS
2.0000 | ORAL_TABLET | Freq: Once | ORAL | Status: AC
  Administered 2016-07-22: 2 via ORAL
  Filled 2016-07-22: qty 2

## 2016-07-22 MED ORDER — CYCLOBENZAPRINE HCL 10 MG PO TABS
5.0000 mg | ORAL_TABLET | Freq: Once | ORAL | Status: AC
  Administered 2016-07-22: 5 mg via ORAL
  Filled 2016-07-22: qty 1

## 2016-07-22 MED ORDER — METHYLPREDNISOLONE 4 MG PO TBPK: ORAL_TABLET | ORAL | 0 refills | Status: AC

## 2016-07-22 MED ORDER — PREDNISONE 50 MG PO TABS
60.0000 mg | ORAL_TABLET | Freq: Once | ORAL | Status: AC
  Administered 2016-07-22: 60 mg via ORAL
  Filled 2016-07-22: qty 1

## 2016-07-22 MED ORDER — METHOCARBAMOL 500 MG PO TABS: 500.0000 mg | ORAL_TABLET | Freq: Two times a day (BID) | ORAL | 0 refills | Status: AC

## 2016-07-22 MED ORDER — OXYCODONE-ACETAMINOPHEN 10-325 MG PO TABS: 1.0000 | ORAL_TABLET | ORAL | 0 refills | Status: AC | PRN
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
## 2016-07-22 NOTE — ED Notes (Signed)
Patient returned from radiology
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
## 2016-07-22 NOTE — Discharge Instructions (Signed)
Call your surgeon tomorrow morning for appointment as soon as possible

## 2016-07-22 NOTE — ED Notes (Signed)
Patient to radiology.

## 2016-07-22 NOTE — ED Notes (Signed)
Patient verbalizes understanding of discharge instructions, prescriptions, home care and follow up care. Patient out of department at this time. 

## 2016-10-10 ENCOUNTER — Other Ambulatory Visit: Payer: Self-pay | Admitting: Ophthalmology

## 2016-10-10 DIAGNOSIS — H44113 Panuveitis, bilateral: Secondary | ICD-10-CM

## 2016-10-22 ENCOUNTER — Ambulatory Visit
Admission: RE | Admit: 2016-10-22 | Discharge: 2016-10-22 | Disposition: A | Discharge status: Home / Self Care | Payer: 59 | Source: Ambulatory Visit | Attending: Ophthalmology | Admitting: Ophthalmology

## 2016-10-22 DIAGNOSIS — H44113 Panuveitis, bilateral: Secondary | ICD-10-CM

## 2016-10-22 MED ORDER — GADOBENATE DIMEGLUMINE 529 MG/ML IV SOLN
9.0000 mL | Freq: Once | INTRAVENOUS | Status: AC | PRN
  Administered 2016-10-22: 9 mL via INTRAVENOUS

## 2016-11-26 ENCOUNTER — Telehealth: Payer: Self-pay | Admitting: Rheumatology

## 2016-11-26 NOTE — Telephone Encounter (Signed)
Lacey LundJanet w/Dr. Sherwood GamblerFusco @ [phone] sent us a referral on this patient and Dr. Sherwood GamblerFusco would like a reason why. Please look at referral in old system. Collagen vascular disease referral?

## 2016-11-27 NOTE — Telephone Encounter (Signed)
It was denied per Dr.Deveshwar.

## 2017-01-30 IMAGING — MR MR HEAD WO/W CM
12 of 18 series · 26 of 48 positions shown · IV contrast (9ml multihance)
Comparison: None.

MRI cervical spine December 17, 2015

CLINICAL DATA: Bilateral uveitis beginning 1.5 years ago. Vision
changes. Status post neck fusion June 2016.

EXAM:
MRI HEAD AND ORBITS WITHOUT AND WITH CONTRAST
TECHNIQUE: Multiplanar, multiecho pulse sequences of the brain and surrounding
structures were obtained without and with intravenous contrast.
Multiplanar, multiecho pulse sequences of the orbits and surrounding
structures were obtained including fat saturation techniques, before
and after intravenous contrast administration.
CONTRAST:  9mL MULTIHANCE GADOBENATE DIMEGLUMINE 529 MG/ML IV SOLN

[Series 2: T1 · sagittal · 5.0mm · 0.45mm/px · 1 of 19 slices shown (1 of 2)]
[im 1/19]
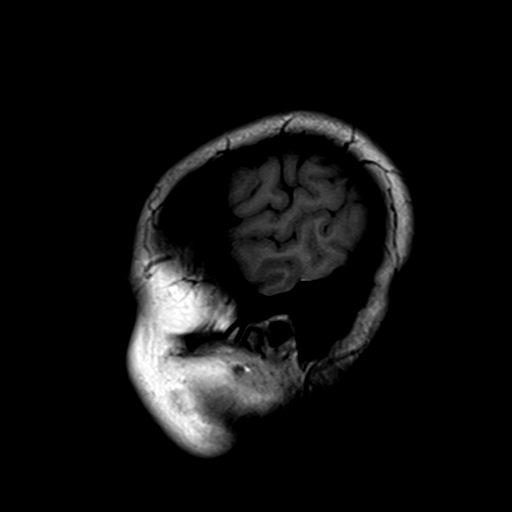

[Series 3: DWI · axial · 5.0mm · 1.80mm/px · z∈[-28,+112]mm · 3 of 44 slices shown (1 of 2)]
[im 1/44]
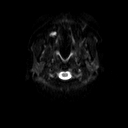
[im 22/44]
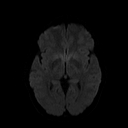
[im 44/44]
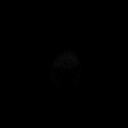

[Series 4: DWI · axial · 5.0mm · 1.80mm/px · z∈[-28,+112]mm · 2 of 22 slices shown (2 of 2)]
[im 1/22]
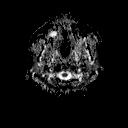
[im 22/22]
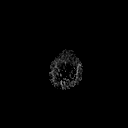

[Series 5: T2 · axial · 5.0mm · 0.51mm/px · z∈[-28,+113]mm · 2 of 22 slices shown (1 of 2)]
[im 1/22]
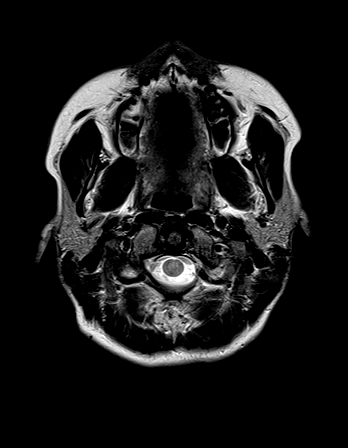
[im 22/22]
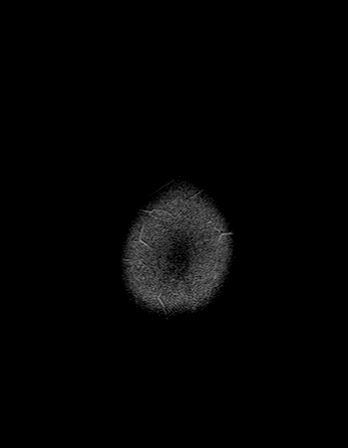

[Series 6: FLAIR · axial · 5.0mm · 0.45mm/px · z∈[-28,+113]mm · 2 of 22 slices shown (1 of 2)]
[im 1/22]
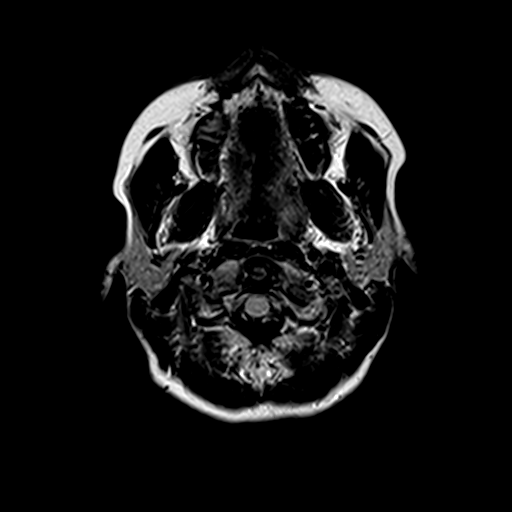
[im 22/22]
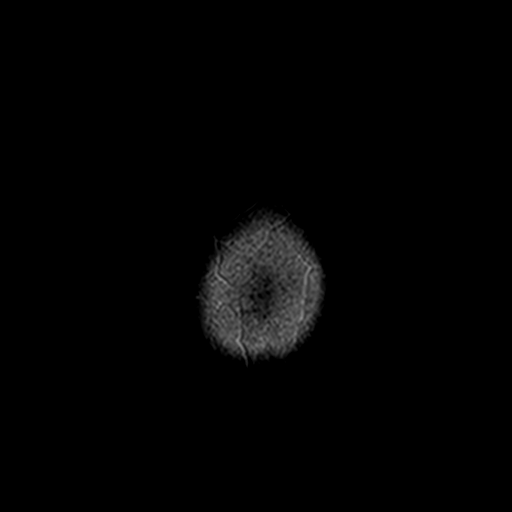

[Series 9: T2 · coronal · 5.0mm · 0.45mm/px · 3 of 28 slices shown (2 of 2)]
[im 1/28]
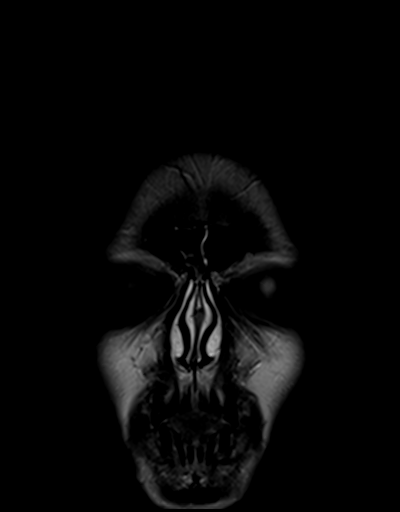
[im 14/28]
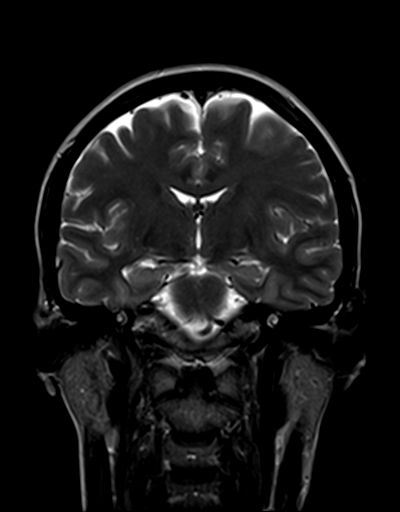
[im 28/28]
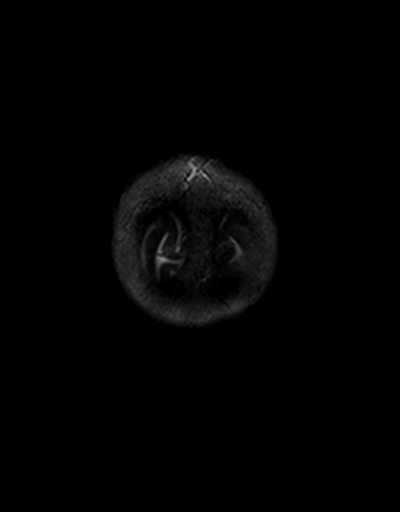

[Series 10: FLAIR · sagittal · 5.0mm · 0.49mm/px · 2 of 19 slices shown (2 of 2)]
[im 1/19]
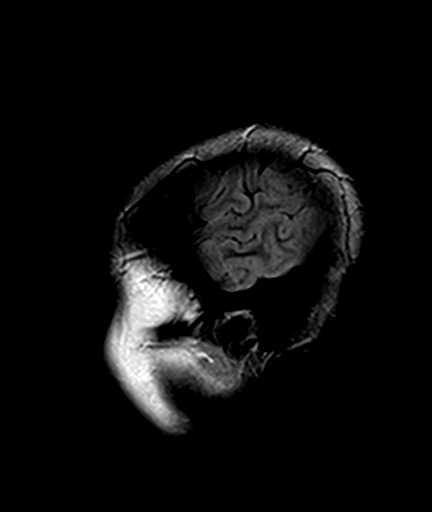
[im 19/19]
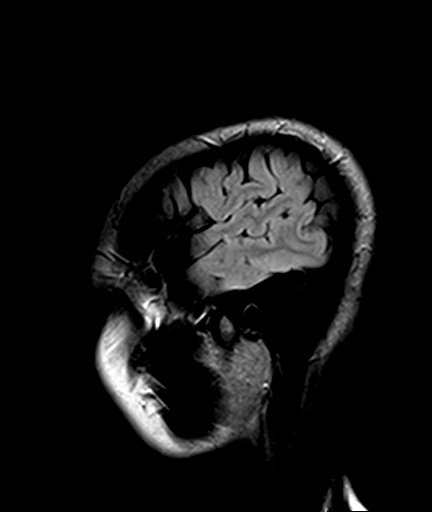

[Series 11: T1 · coronal · 3.0mm · 0.35mm/px · 2 of 24 slices shown (2 of 2)]
[im 1/24]
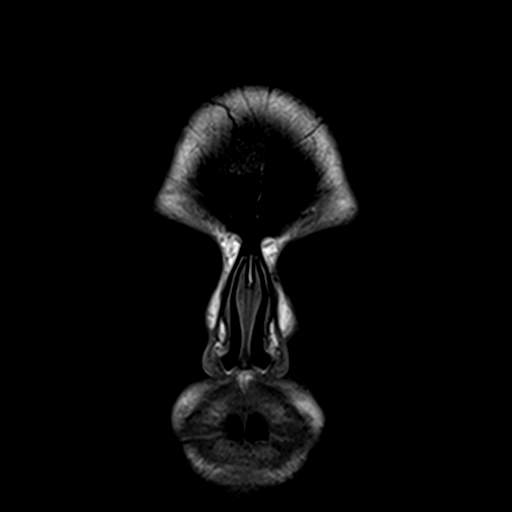
[im 24/24]
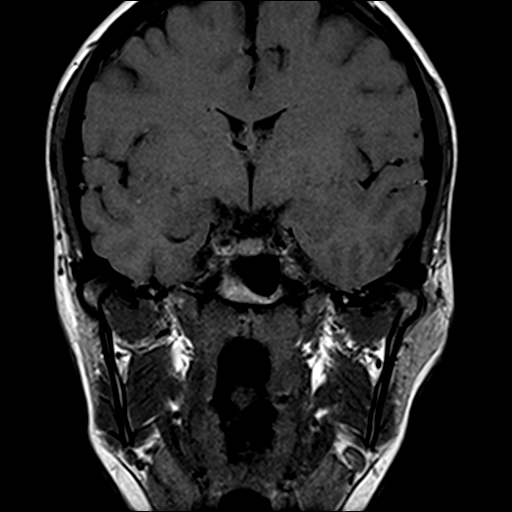

[Series 13: T2 fat-sat · coronal · 3.0mm · 0.35mm/px · 2 of 24 slices shown (1 of 3)]
[im 1/24]
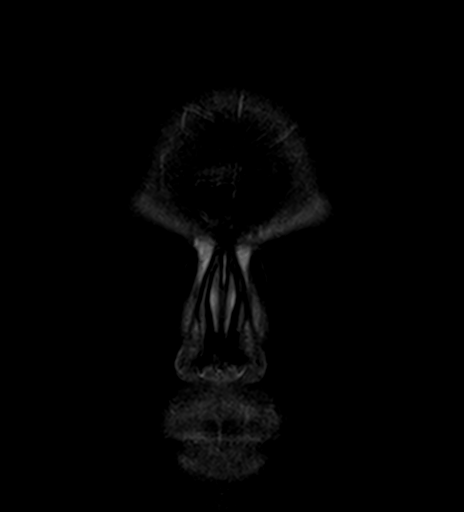
[im 24/24]
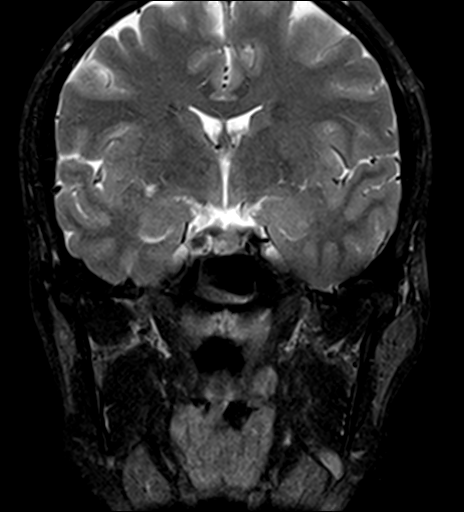

[Series 14: T2 fat-sat · axial · 3.0mm · 0.35mm/px · z∈[-8,+48]mm · 2 of 18 slices shown (2 of 3)]
[im 1/18]
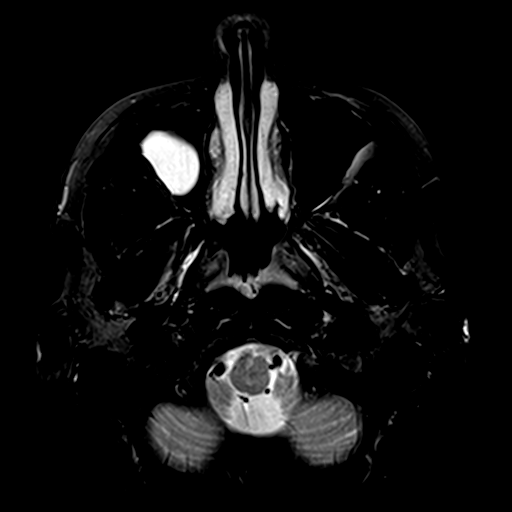
[im 18/18]
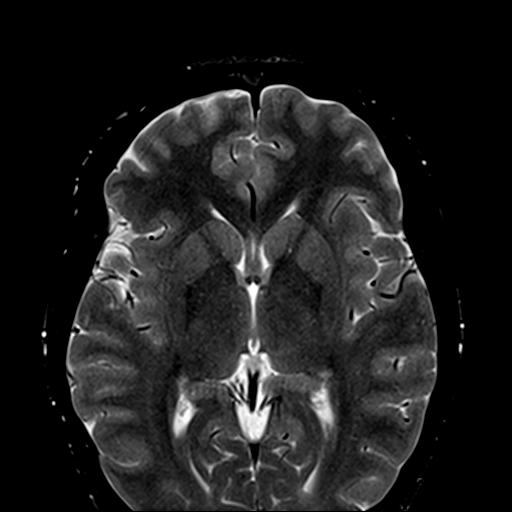

[Series 15: T2 fat-sat · coronal · 3.0mm · 0.35mm/px · 2 of 24 slices shown (3 of 3)]
[im 1/24]
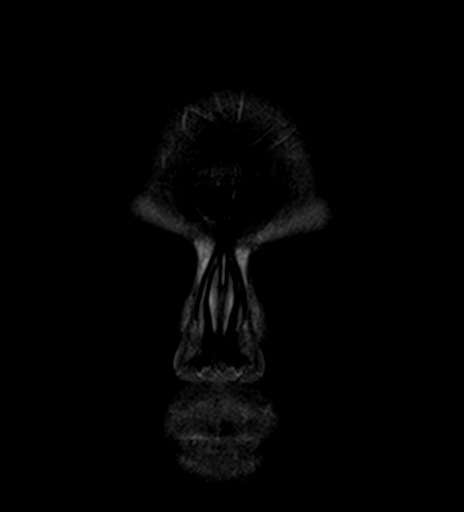
[im 24/24]
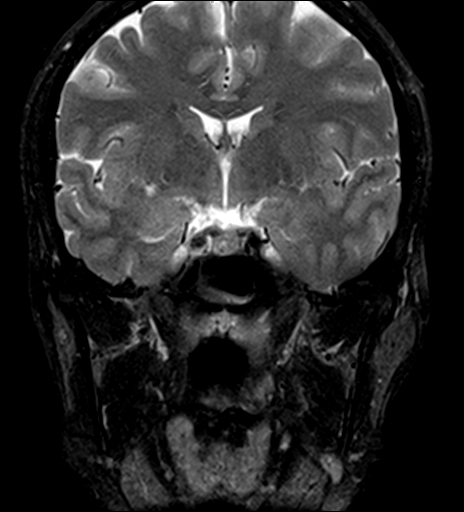

[Series 17: T1 post-contrast · coronal · 5.0mm · 0.45mm/px · 3 of 28 slices shown]
[im 1/28]
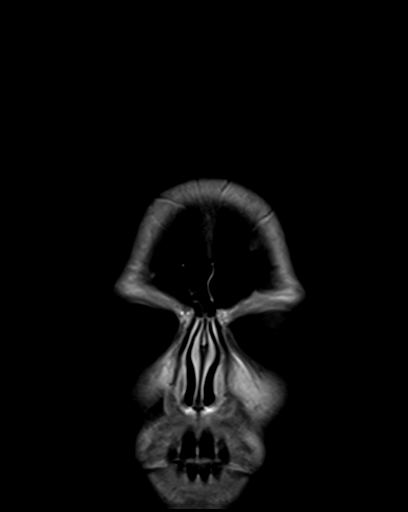
[im 14/28]
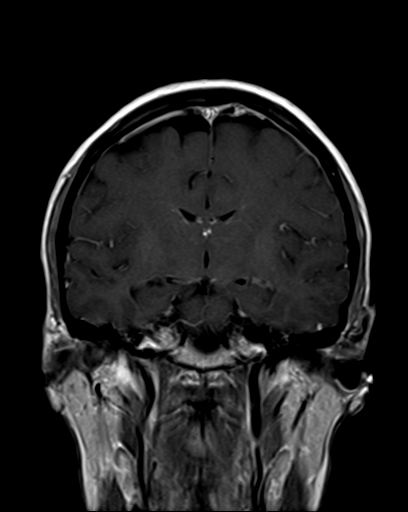
[im 28/28]
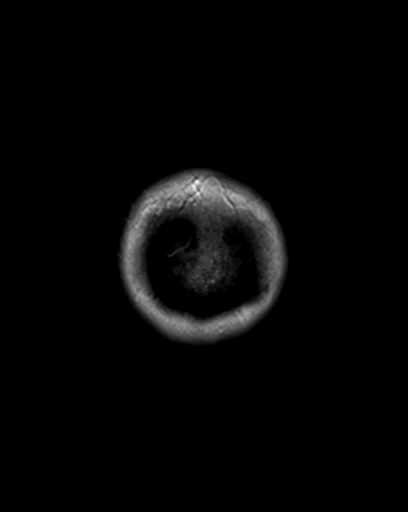

[26 of 48 positions shown; findings below may reference images not displayed]

FINDINGS: MRI HEAD FINDINGS

INTRACRANIAL CONTENTS: No reduced diffusion to suggest acute
ischemia or hyperacute demyelination. No susceptibility artifact to
suggest hemorrhage. The ventricles and sulci are normal for
patient's age. No suspicious parenchymal signal, masses, mass
effect. No abnormal intraparenchymal or extra-axial enhancement. No
abnormal extra-axial fluid collections. No extra-axial masses.

VASCULAR: Normal major intracranial vascular flow voids present at
skull base.

SKULL AND UPPER CERVICAL SPINE: No abnormal sellar expansion. No
suspicious calvarial bone marrow signal. Craniocervical junction
maintained.

OTHER: Susceptibility artifact midcervical spine attributed to ACDF.

MRI ORBITS FINDINGS

ORBITS: Ocular globes are intact with normal signal. Lenses are
located. Preservation of the orbital fat. Normal appearance of the
optic nerves and nerve sheaths. Normal symmetric appearance of the
extraocular muscles. No intra-ocular mass, signal abnormality nor
abnormal enhancement. Superior ophthalmic veins are not enlarged.

VISUALIZED SINUSES: RIGHT maxillary mucosal retention cyst. Trace
fronto ethmoidal mucosal thickening. No paranasal sinus air-fluid
levels. Mastoid air cells are well aerated.

SOFT TISSUES: Normal.
IMPRESSION: Normal MRI of the brain with and without contrast.

Normal MRI of the orbits with and without contrast.

## 2020-07-22 ENCOUNTER — Other Ambulatory Visit: Payer: Self-pay

## 2020-07-22 DIAGNOSIS — U071 COVID-19: Secondary | ICD-10-CM | POA: Insufficient documentation

## 2020-07-22 DIAGNOSIS — R509 Fever, unspecified: Secondary | ICD-10-CM | POA: Diagnosis present

## 2020-07-22 DIAGNOSIS — Z79899 Other long term (current) drug therapy: Secondary | ICD-10-CM | POA: Insufficient documentation

## 2020-07-22 DIAGNOSIS — E119 Type 2 diabetes mellitus without complications: Secondary | ICD-10-CM | POA: Diagnosis not present

## 2020-07-22 DIAGNOSIS — Z87891 Personal history of nicotine dependence: Secondary | ICD-10-CM | POA: Insufficient documentation

## 2020-07-23 ENCOUNTER — Encounter (HOSPITAL_COMMUNITY): Payer: Self-pay | Admitting: Emergency Medicine

## 2020-07-23 ENCOUNTER — Emergency Department (HOSPITAL_COMMUNITY)
Admission: EM | Admit: 2020-07-23 | Discharge: 2020-07-23 | Disposition: A | Discharge status: Home / Self Care | Payer: No Typology Code available for payment source | Attending: Emergency Medicine | Admitting: Emergency Medicine

## 2020-07-23 DIAGNOSIS — U071 COVID-19: Secondary | ICD-10-CM

## 2020-07-23 LAB — SARS CORONAVIRUS 2 BY RT PCR (HOSPITAL ORDER, PERFORMED IN ~~LOC~~ HOSPITAL LAB): SARS Coronavirus 2: POSITIVE — AB

## 2020-07-23 NOTE — ED Notes (Signed)
Date and time results received: 07/23/20 2:23 AM (use smartphrase ".now" to insert current time)  Test: covid Critical Value: positive  Name of Provider Notified: pollina  Orders Received? Or Actions Taken?: Actions Taken: notified

## 2020-07-23 NOTE — ED Triage Notes (Signed)
Pt here with her husband for Covid Test. Denies any other symptoms.

## 2020-07-23 NOTE — ED Provider Notes (Signed)
[facility] EMERGENCY DEPARTMENT Provider Note   CSN: [id number] Arrival date & time: 07/22/20  2338     History Chief Complaint  Patient presents with  . Possible Covid    Lacey Hicks is a 39 y.o. female.  Patient presents to the emergency department for evaluation of possible Covid. Patient has been sick for 3 days with nasal congestion, fever, generalized aches and pains. Spouse has similar symptoms.        Past Medical History:  Diagnosis Date  . Anal fissure   . Anxiety   . Arthritis   . Chronic neck pain   . Constipation   . Diabetes mellitus without complication (HCC)   . Hemorrhoids   . Migraines   . Prediabetes   . Urinary tract infection     Patient Active Problem List   Diagnosis Date Noted  . IBS (irritable bowel syndrome) 08/25/2013  . Abdominal pain, epigastric 08/02/2013  . Anal fissure 11/22/2012  . Hemorrhoids, external, with complication 11/22/2012  . Hemorrhoids, internal, with bleeding 11/22/2012    Past Surgical History:  Procedure Laterality Date  . ANAL SPHINCTEROTOMY  2014  . BUNIONECTOMY    . CERVICAL FUSION    . CESAREAN SECTION  04/10/99,10/01/03,07/18/10  . HEMORROIDECTOMY  2014  . LAPAROSCOPIC ASSISTED VAGINAL HYSTERECTOMY N/A 04/14/2013   Procedure: LAPAROSCOPIC ASSISTED VAGINAL HYSTERECTOMY;  Surgeon: Jeani Hawking, MD;  Location: [facility];  Service: Gynecology;  Laterality: N/A;  . TONSILLECTOMY    . TUBAL LIGATION    . WISDOM TOOTH EXTRACTION  1997     OB History    Gravida  4   Para  3   Term      Preterm      AB  1   Living  3     SAB  1   TAB      Ectopic      Multiple      Live Births  3           Family History  Problem Relation Age of Onset  . Ovarian cancer Mother   . Diabetes Paternal Grandmother   . Heart disease Paternal Grandfather   . Colon cancer Neg Hx     Social History   Tobacco Use  . Smoking status: Former Smoker    Packs/day: 2.00    Years: 10.00    Pack  years: 20.00    Types: Cigarettes  . Smokeless tobacco: Former Neurosurgeon    Quit date: 08/01/2012  Substance Use Topics  . Alcohol use: No    Comment: social  . Drug use: Yes    Types: Marijuana    Home Medications Prior to Admission medications   Medication Sig Start Date End Date Taking? Authorizing Provider  albuterol (PROVENTIL HFA;VENTOLIN HFA) 108 (90 BASE) MCG/ACT inhaler Inhale 2 puffs into the lungs every 6 (six) hours as needed for wheezing.    [provider]  amphetamine-dextroamphetamine (ADDERALL) 20 MG tablet Take 20 mg by mouth 3 (three) times daily. 04/10/16   [provider]  diazepam (VALIUM) 10 MG tablet Take 10 mg by mouth 4 (four) times daily as needed (muscle spasms in neck).     [provider]  docusate sodium (COLACE) 100 MG capsule Take 100-400 mg by mouth daily as needed for mild constipation or moderate constipation.     [provider]  EPIPEN 2-PAK 0.3 MG/0.3ML SOAJ injection Inject 0.3 mg into the muscle.  06/30/13   [provider]  gabapentin (NEURONTIN) 100 MG capsule Take 100 mg by mouth 3 (three) times daily. 07/04/16   [provider]  HYDROcodone-acetaminophen (NORCO/VICODIN) 5-325 MG tablet Take one-two tabs po q 4-6 hrs prn pain Patient not taking: Reported on 07/22/2016 04/18/16   Pauline Aus, PA-C  HYDROcodone-acetaminophen (NORCO/VICODIN) 5-325 MG tablet Take one-two tabs po q 4-6 hrs prn pain Patient not taking: Reported on 07/22/2016 04/18/16   Triplett, Tammy, PA-C  methocarbamol (ROBAXIN) 500 MG tablet Take 1 tablet (500 mg total) by mouth 2 (two) times daily. 07/22/16   Rolland Porter, MD  methylPREDNISolone (MEDROL DOSEPAK) 4 MG TBPK tablet 6 po on day 1, decrease by 1 tab per day 07/22/16   Rolland Porter, MD  oxyCODONE-acetaminophen (PERCOCET) 10-325 MG tablet Take 1 tablet by mouth every 4 (four) hours as needed for pain. 07/22/16   Rolland Porter, MD    Allergies    Other, Wellbutrin [bupropion hcl],  Cephalosporins, Dust mite extract, and Mold extract [trichophyton]  Review of Systems   Review of Systems  Constitutional: Positive for chills, fatigue and fever.  HENT: Positive for congestion.   Musculoskeletal: Positive for myalgias.  All other systems reviewed and are negative.   Physical Exam Updated Vital Signs BP 107/74 (BP Location: Right Arm)   Pulse 79   Temp 98.2 F (36.8 C) (Oral)   Resp 16   Ht 5\' 2"  (1.575 m)   Wt 56.7 kg   LMP 03/26/2013   SpO2 99%   BMI 22.86 kg/m   Physical Exam Vitals and nursing note reviewed.  Constitutional:      General: She is not in acute distress.    Appearance: Normal appearance. She is well-developed.  HENT:     Head: Normocephalic and atraumatic.     Right Ear: Hearing normal.     Left Ear: Hearing normal.     Nose: Nose normal.  Eyes:     Conjunctiva/sclera: Conjunctivae normal.     Pupils: Pupils are equal, round, and reactive to light.  Cardiovascular:     Rate and Rhythm: Regular rhythm.     Heart sounds: S1 normal and S2 normal. No murmur heard.  No friction rub. No gallop.   Pulmonary:     Effort: Pulmonary effort is normal. No respiratory distress.     Breath sounds: Normal breath sounds.  Chest:     Chest wall: No tenderness.  Abdominal:     General: Bowel sounds are normal.     Palpations: Abdomen is soft.     Tenderness: There is no abdominal tenderness. There is no guarding or rebound. Negative signs include Murphy's sign and McBurney's sign.     Hernia: No hernia is present.  Musculoskeletal:        General: Normal range of motion.     Cervical back: Normal range of motion and neck supple.  Skin:    General: Skin is warm and dry.     Findings: No rash.  Neurological:     Mental Status: She is alert and oriented to person, place, and time.     GCS: GCS eye subscore is 4. GCS verbal subscore is 5. GCS motor subscore is 6.     Cranial Nerves: No cranial nerve deficit.     Sensory: No sensory deficit.      Coordination: Coordination normal.  Psychiatric:        Speech: Speech normal.        Behavior: Behavior normal.  Thought Content: Thought content normal.     ED Results / Procedures / Treatments   Labs (all labs ordered are listed, but only abnormal results are displayed) Labs Reviewed  SARS CORONAVIRUS 2 BY RT PCR (HOSPITAL ORDER, PERFORMED IN [facility]) - Abnormal; Notable for the following components:      Result Value   SARS Coronavirus 2 POSITIVE (*)    All other components within normal limits    EKG None  Radiology No results found.  Procedures Procedures (including critical care time)  Medications Ordered in ED Medications - No data to display  ED Course  I have reviewed the triage vital signs and the nursing notes.  Pertinent labs & imaging results that were available during my care of the patient were reviewed by me and considered in my medical decision making (see chart for details).    MDM Rules/Calculators/A&P                          Patient appears well. Vital signs are normal. Lungs are clear, no respiratory complaints at this time. Covid positive. Patient counseled on quarantine and what to return to the ED for.  Final Clinical Impression(s) / ED Diagnoses Final diagnoses:  COVID-19 virus infection    Rx / DC Orders ED Discharge Orders    None       Kymari Lollis, Canary Brim, MD 07/23/20 [phone]

## 2020-07-24 ENCOUNTER — Telehealth: Payer: Self-pay | Admitting: Family

## 2020-07-24 NOTE — Telephone Encounter (Signed)
Called to Discuss with patient about Covid symptoms and the use of the monoclonal antibody infusion for those with mild to moderate Covid symptoms and at a high risk of hospitalization.     Pt appears to qualify for this infusion due to co-morbid conditions and/or a member of an at-risk group in accordance with the FDA Emergency Use Authorization.   Lacey Hicks was seen at [facility] ED on 8/30 with fever, congestion, and generalized aches and pains. Risk factors include diabetes and taking medication for asthma.    Unable to reach pt - information sent to MyChart.   Marcos Eke, NP
